# Patient Record
Sex: Male | Born: 1970 | Race: Black or African American | Hispanic: No | Marital: Married | Smoking: Never smoker
Health system: Southern US, Community
[De-identification: ages and names within clinical notes are randomized; demographics above are authoritative.]

## PROBLEM LIST (undated history)

## (undated) DIAGNOSIS — Z8719 Personal history of other diseases of the digestive system: Secondary | ICD-10-CM

## (undated) DIAGNOSIS — K4020 Bilateral inguinal hernia, without obstruction or gangrene, not specified as recurrent: Secondary | ICD-10-CM

## (undated) DIAGNOSIS — E269 Hyperaldosteronism, unspecified: Secondary | ICD-10-CM

## (undated) DIAGNOSIS — H25012 Cortical age-related cataract, left eye: Secondary | ICD-10-CM

## (undated) DIAGNOSIS — H3530 Unspecified macular degeneration: Secondary | ICD-10-CM

## (undated) DIAGNOSIS — H268 Other specified cataract: Secondary | ICD-10-CM

## (undated) DIAGNOSIS — H35359 Cystoid macular degeneration, unspecified eye: Secondary | ICD-10-CM

## (undated) DIAGNOSIS — H4440 Unspecified hypotony of eye: Secondary | ICD-10-CM

## (undated) DIAGNOSIS — N189 Chronic kidney disease, unspecified: Secondary | ICD-10-CM

## (undated) DIAGNOSIS — M1990 Unspecified osteoarthritis, unspecified site: Secondary | ICD-10-CM

## (undated) DIAGNOSIS — H2701 Aphakia, right eye: Secondary | ICD-10-CM

## (undated) DIAGNOSIS — T8859XA Other complications of anesthesia, initial encounter: Secondary | ICD-10-CM

## (undated) DIAGNOSIS — T868419 Corneal transplant failure, unspecified eye: Secondary | ICD-10-CM

## (undated) DIAGNOSIS — T86841 Corneal transplant failure: Secondary | ICD-10-CM

## (undated) DIAGNOSIS — H209 Unspecified iridocyclitis: Secondary | ICD-10-CM

## (undated) DIAGNOSIS — H401190 Primary open-angle glaucoma, unspecified eye, stage unspecified: Secondary | ICD-10-CM

## (undated) DIAGNOSIS — I10 Essential (primary) hypertension: Secondary | ICD-10-CM

## (undated) DIAGNOSIS — K219 Gastro-esophageal reflux disease without esophagitis: Secondary | ICD-10-CM

## (undated) DIAGNOSIS — H4040X0 Glaucoma secondary to eye inflammation, unspecified eye, stage unspecified: Secondary | ICD-10-CM

## (undated) DIAGNOSIS — T4145XA Adverse effect of unspecified anesthetic, initial encounter: Secondary | ICD-10-CM

## (undated) DIAGNOSIS — H44119 Panuveitis, unspecified eye: Secondary | ICD-10-CM

## (undated) HISTORY — PX: CORNEAL TRANSPLANT: SHX108

## (undated) HISTORY — PX: FRACTURE SURGERY: SHX138

## (undated) HISTORY — DX: Corneal transplant failure: T86.841

## (undated) HISTORY — DX: Unspecified iridocyclitis: H20.9

## (undated) HISTORY — DX: Corneal transplant failure, unspecified eye: T86.8419

## (undated) HISTORY — DX: Other specified cataract: H26.8

## (undated) HISTORY — DX: Glaucoma secondary to eye inflammation, unspecified eye, stage unspecified: H40.40X0

## (undated) HISTORY — DX: Essential (primary) hypertension: I10

## (undated) HISTORY — DX: Panuveitis, unspecified eye: H44.119

## (undated) HISTORY — DX: Cystoid macular degeneration, unspecified eye: H35.359

## (undated) HISTORY — PX: OTHER SURGICAL HISTORY: SHX169

## (undated) HISTORY — DX: Unspecified macular degeneration: H35.30

## (undated) HISTORY — DX: Cortical age-related cataract, left eye: H25.012

## (undated) HISTORY — DX: Primary open-angle glaucoma, unspecified eye, stage unspecified: H40.1190

## (undated) HISTORY — DX: Unspecified hypotony of eye: H44.40

## (undated) HISTORY — DX: Aphakia, right eye: H27.01

## (undated) HISTORY — DX: Hyperaldosteronism, unspecified: E26.9

---

## 2009-09-18 ENCOUNTER — Emergency Department: Payer: Self-pay | Admitting: Emergency Medicine

## 2009-12-15 ENCOUNTER — Inpatient Hospital Stay: Payer: Self-pay | Admitting: Internal Medicine

## 2011-08-30 DIAGNOSIS — H209 Unspecified iridocyclitis: Secondary | ICD-10-CM | POA: Insufficient documentation

## 2011-08-30 DIAGNOSIS — H268 Other specified cataract: Secondary | ICD-10-CM | POA: Insufficient documentation

## 2011-08-31 DIAGNOSIS — H401190 Primary open-angle glaucoma, unspecified eye, stage unspecified: Secondary | ICD-10-CM | POA: Insufficient documentation

## 2011-09-24 DIAGNOSIS — I10 Essential (primary) hypertension: Secondary | ICD-10-CM | POA: Insufficient documentation

## 2012-05-29 ENCOUNTER — Ambulatory Visit: Payer: Self-pay

## 2012-05-29 LAB — CREATININE, SERUM: EGFR (Non-African Amer.): 42 — ABNORMAL LOW

## 2013-03-30 DIAGNOSIS — H35359 Cystoid macular degeneration, unspecified eye: Secondary | ICD-10-CM | POA: Insufficient documentation

## 2014-01-11 HISTORY — PX: ADRENAL GLAND SURGERY: SHX544

## 2014-05-17 DIAGNOSIS — I10 Essential (primary) hypertension: Secondary | ICD-10-CM | POA: Insufficient documentation

## 2014-05-17 DIAGNOSIS — T868419 Corneal transplant failure, unspecified eye: Secondary | ICD-10-CM | POA: Insufficient documentation

## 2014-05-17 DIAGNOSIS — H2701 Aphakia, right eye: Secondary | ICD-10-CM | POA: Insufficient documentation

## 2014-05-17 DIAGNOSIS — H4440 Unspecified hypotony of eye: Secondary | ICD-10-CM | POA: Insufficient documentation

## 2014-05-17 DIAGNOSIS — T86841 Corneal transplant failure: Secondary | ICD-10-CM

## 2015-01-12 HISTORY — PX: EYE SURGERY: SHX253

## 2015-05-22 DIAGNOSIS — E269 Hyperaldosteronism, unspecified: Secondary | ICD-10-CM | POA: Insufficient documentation

## 2015-05-22 DIAGNOSIS — I159 Secondary hypertension, unspecified: Secondary | ICD-10-CM | POA: Insufficient documentation

## 2015-06-17 DIAGNOSIS — H25012 Cortical age-related cataract, left eye: Secondary | ICD-10-CM | POA: Insufficient documentation

## 2015-06-17 DIAGNOSIS — H2512 Age-related nuclear cataract, left eye: Secondary | ICD-10-CM | POA: Insufficient documentation

## 2015-10-10 DIAGNOSIS — Z947 Corneal transplant status: Secondary | ICD-10-CM | POA: Insufficient documentation

## 2016-03-04 DIAGNOSIS — H44119 Panuveitis, unspecified eye: Secondary | ICD-10-CM | POA: Insufficient documentation

## 2016-03-04 DIAGNOSIS — H3530 Unspecified macular degeneration: Secondary | ICD-10-CM | POA: Insufficient documentation

## 2016-03-08 ENCOUNTER — Encounter: Payer: Self-pay | Admitting: Surgery

## 2016-03-08 ENCOUNTER — Ambulatory Visit (INDEPENDENT_AMBULATORY_CARE_PROVIDER_SITE_OTHER): Payer: Medicare Other | Admitting: Surgery

## 2016-03-08 VITALS — BP 148/99 | HR 114 | Temp 98.3°F | Ht 68.0 in | Wt 174.0 lb

## 2016-03-08 DIAGNOSIS — K4020 Bilateral inguinal hernia, without obstruction or gangrene, not specified as recurrent: Secondary | ICD-10-CM | POA: Diagnosis not present

## 2016-03-08 NOTE — Patient Instructions (Signed)
Inguinal Hernia, Adult Introduction An inguinal hernia is when fat or the intestines push through the area where the leg meets the lower belly (groin) and make a rounded lump (bulge). This condition happens over time. There are three types of inguinal hernias. These types include:  Hernias that can be pushed back into the belly (are reducible).  Hernias that cannot be pushed back into the belly (are incarcerated).  Hernias that cannot be pushed back into the belly and lose their blood supply (get strangulated). This type needs emergency surgery. Follow these instructions at home: Lifestyle  Drink enough fluid to keep your urine (pee) clear or pale yellow.  Eat plenty of fruits, vegetables, and whole grains. These have a lot of fiber. Talk with your doctor if you have questions.  Avoid lifting heavy objects.  Avoid standing for long periods of time.  Do not use tobacco products. These include cigarettes, chewing tobacco, or e-cigarettes. If you need help quitting, ask your doctor.  Try to stay at a healthy weight. General instructions  Do not try to force the hernia back in.  Watch your hernia for any changes in color or size. Let your doctor know if there are any changes.  Take over-the-counter and prescription medicines only as told by your doctor.  Keep all follow-up visits as told by your doctor. This is important. Contact a doctor if:  You have a fever.  You have new symptoms.  Your symptoms get worse. Get help right away if:  The area where the legs meets the lower belly has:  Pain that gets worse suddenly.  A bulge that gets bigger suddenly and does not go down.  A bulge that turns red or purple.  A bulge that is painful to the touch.  You are a man and your scrotum:  Suddenly feels painful.  Suddenly changes in size.  You feel sick to your stomach (nauseous) and this feeling does not go away.  You throw up (vomit) and this keeps happening.  You feel  your heart beating a lot more quickly than normal.  You cannot poop (have a bowel movement) or pass gas. This information is not intended to replace advice given to you by your health care provider. Make sure you discuss any questions you have with your health care provider. Document Released: 01/28/2006 Document Revised: 06/05/2015 Document Reviewed: 11/07/2013  2017 Elsevier  

## 2016-03-08 NOTE — Progress Notes (Signed)
03/08/2016  Reason for Visit:  Right inguinal hernia  History of Present Illness: Christopher Mcdaniel is a 46 y.o. male referred by his PCP for evaluation of a right inguinal hernia. The patient reports that he noted this about 6-8 months ago on the right groin. He feels that this may have pain increasing in size over these months. However he has very minimal discomfort if any at all. He reports that its flatter during the morning time when he wakes up and then bulges out more as the day goes by. However he is able to reduce it without any discomfort.  Reports regular bowel movements and flatus. Denies any fevers or chills, abdominal pain, chest pain or shortness of breath. Denies any bulging on the left side.  The patient does report that he has episodes of nausea and vomiting in the past, which he attributes to acid reflux. He started taking Nexium on a daily basis and this has improved his symptoms.   Past Medical History: Past Medical History:  Diagnosis Date  . Aphakia, right   . Cataract cortical, senile, left   . CME (cystoid macular edema)   . Failed corneal transplant   . Hyperaldosteronism ([place])   . Hypertension   . Hypotony of eye   . Macular degeneration   . Mature cataract   . Panuveitis   . POAG (primary open-angle glaucoma)   . Uveitic glaucoma      Past Surgical History: --Transplanted cornea --Left knee surgery  Home Medications: Prior to Admission medications   Medication Sig Start Date End Date Taking? Authorizing Provider  Aspirin-Acetaminophen-Caffeine (EXCEDRIN PO) Take 1 tablet by mouth 1 day or 1 dose.    Historical Provider, MD  brimonidine-timolol (COMBIGAN) 0.2-0.5 % ophthalmic solution Apply 1 drop to eye 1 day or 1 dose. 06/27/15 06/26/16  Historical Provider, MD  carvedilol (COREG) 25 MG tablet Take 50 mg by mouth 1 day or 1 dose.    Historical Provider, MD  chlorthalidone (HYGROTON) 50 MG tablet Take 1 tablet by mouth 1 day or 1 dose.    Historical  Provider, MD  cloNIDine (CATAPRES - DOSED IN MG/24 HR) 0.2 mg/24hr patch Place 1 mg onto the skin 1 day or 1 dose.    Historical Provider, MD  cyclopentolate (CYCLODRYL,CYCLOGYL) 1 % ophthalmic solution Apply 1 drop to eye 1 day or 1 dose. 06/30/15   Historical Provider, MD  diphenhydrAMINE (BENADRYL) 25 MG tablet Take 1 tablet by mouth 1 day or 1 dose.    Historical Provider, MD  dorzolamide (TRUSOPT) 2 % ophthalmic solution Apply 1 drop to eye 1 day or 1 dose. 11/19/14   Historical Provider, MD  hydrALAZINE (APRESOLINE) 10 MG tablet Take 10 mg by mouth 1 day or 1 dose.    Historical Provider, MD  NIFEdipine (ADALAT CC) 90 MG 24 hr tablet Take 90 mg by mouth 1 day or 1 dose.    Historical Provider, MD  prednisoLONE acetate (PRED FORTE) 1 % ophthalmic suspension Place 1 drop into both eyes 1 day or 1 dose.    Historical Provider, MD  quinapril (ACCUPRIL) 40 MG tablet Take 40 mg by mouth 1 day or 1 dose.    Historical Provider, MD  spironolactone (ALDACTONE) 100 MG tablet Take 1 tablet by mouth 1 day or 1 dose. 02/18/16 02/17/17  Historical Provider, MD  timolol (TIMOPTIC) 0.25 % ophthalmic solution Place 1 drop into both eyes 1 day or 1 dose.    Historical Provider, MD  triamterene-hydrochlorothiazide (MAXZIDE) 75-50 MG tablet Take 1 tablet by mouth 1 day or 1 dose.    Historical Provider, MD    Allergies: Allergies  Allergen Reactions  . Quinapril Swelling    angioedema    Social History:  reports that he has never smoked. He has never used smokeless tobacco. He reports that he does not drink alcohol or use drugs.   Family History: No family history on file.  Review of Systems: Review of Systems  Constitutional: Negative for chills and fever.  HENT: Negative for hearing loss.   Respiratory: Negative for cough and shortness of breath.   Cardiovascular: Negative for chest pain and leg swelling.  Gastrointestinal: Positive for nausea and vomiting. Negative for abdominal pain, constipation  and diarrhea.  Genitourinary: Negative for dysuria and hematuria.  Musculoskeletal: Negative for myalgias.  Skin: Negative for rash.  Neurological: Negative for dizziness.  Psychiatric/Behavioral: Negative for depression.  All other systems reviewed and are negative.   Physical Exam BP (!) 148/99   Pulse (!) 114   Temp 98.3 F (36.8 C) (Oral)   Ht 5\' 8"  (1.727 m)   Wt 78.9 kg (174 lb)   BMI 26.46 kg/m  CONSTITUTIONAL: No acute distress HEENT:  Normocephalic, atraumatic, extraocular motion intact. NECK: Trachea is midline, and there is no jugular venous distension.  RESPIRATORY:  Lungs are clear, and breath sounds are equal bilaterally. Normal respiratory effort without pathologic use of accessory muscles. CARDIOVASCULAR: Heart is regular without murmurs, gallops, or rubs. GI: The abdomen is soft, nondistended, nontender to palpation. The patient has medium size right inguinal hernia which is reducible as well as a small sized left inguinal hernia which is also reducible. Neither one causes any discomfort. There is no umbilical or ventral hernias. MUSCULOSKELETAL:  Normal muscle strength and tone in all four extremities.  No peripheral edema or cyanosis. SKIN: Skin turgor is normal. There are no pathologic skin lesions.  NEUROLOGIC:  Motor and sensation is grossly normal.  Cranial nerves are grossly intact. PSYCH:  Alert and oriented to person, place and time. Affect is normal.  Laboratory Analysis: No results found for this or any previous visit (from the past 24 hour(s)).  Imaging: No results found.  Assessment and Plan: This is a 46 y.o. male with bilateral inguinal hernias, currently asymptomatic.  Discussed with the patient that currently given the minimal discomfort from the inguinal hernias, there is no specific urgency to repair them. Discussed with the patient the different types of inguinal hernias including a reducible, incarcerated, and strangulated. Patient is aware  that currently his hernias are reducible, but the patient does understand the possible complications with having an inguinal hernia that could require emergency surgery. However given that he is currently not having any symptoms, it is reasonable to do watchful waiting. The patient does understand that these hernias left untreated can continue growing in size.  The patient did ask about any lifting or restrictions that he has. I informed the patient to try to avoid any significant heavy lifting as that would cause the hernias to bulge out more and potentially be more comfortable. Also recommended that he could wear a hernia belt or truss that he could buy over-the-counter for comfort.   The patient understands that he can always call the office back and set up a new appointment if he changes his mind and wishes to have surgical repair.  Face-to-face time spent with the patient and care providers was 45 minutes, with more than 50% of  the time spent counseling, educating, and coordinating care of the patient.     Melvyn Neth, [place]

## 2016-03-30 ENCOUNTER — Ambulatory Visit: Payer: Medicare Other | Admitting: Podiatry

## 2016-04-16 ENCOUNTER — Encounter: Payer: Self-pay | Admitting: Podiatry

## 2016-04-16 ENCOUNTER — Ambulatory Visit (INDEPENDENT_AMBULATORY_CARE_PROVIDER_SITE_OTHER): Payer: Medicare Other | Admitting: Podiatry

## 2016-04-16 ENCOUNTER — Ambulatory Visit (INDEPENDENT_AMBULATORY_CARE_PROVIDER_SITE_OTHER): Payer: Medicare Other

## 2016-04-16 VITALS — BP 139/101 | HR 86 | Resp 16

## 2016-04-16 DIAGNOSIS — D492 Neoplasm of unspecified behavior of bone, soft tissue, and skin: Secondary | ICD-10-CM

## 2016-04-16 NOTE — Progress Notes (Signed)
   Subjective:  Patient presents today for evaluation of right great toe swelling has been going on for proximal he 6-7 months now. Patient has tried ice, heat with no relief. Patient is not diabetic. Patient states that when he wears his tennis shoes with significant pressure to his right great toe. Patient denies trauma.    Objective/Physical Exam General: The patient is alert and oriented x3 in no acute distress.  Dermatology: Skin is warm, dry and supple bilateral lower extremities. Negative for open lesions or macerations.  Vascular: Palpable pedal pulses bilaterally. No edema or erythema noted. Capillary refill within normal limits.  Neurological: Epicritic and protective threshold grossly intact bilaterally.   Musculoskeletal Exam: Large palpable mass noted to the plantar lateral aspect of the right great toe.  Radiographic Exam:  Normal osseous mineralization. Joint spaces preserved. No fracture/dislocation/boney destruction.  No evidence of fluid-filled abscess or cyst or osseous tumor.  Assessment: #1 soft tissue tumor right great toe 6-7 months   Plan of Care:  #1 Patient was evaluated. X-rays were reviewed #2 today were going to order an MRI of the right great toe to visualize a soft tissue tumor. #3 patient may require surgical intervention pending MRI results. #4 return to clinic in 3 weeks   Edrick Kins, DPM [facility]  Dr. Edrick Kins, [address]                                        [place], [place] [address]                Office [phone]  Fax [phone]

## 2016-04-19 ENCOUNTER — Telehealth: Payer: Self-pay | Admitting: Podiatry

## 2016-04-19 DIAGNOSIS — D492 Neoplasm of unspecified behavior of bone, soft tissue, and skin: Secondary | ICD-10-CM

## 2016-04-19 DIAGNOSIS — Z01818 Encounter for other preprocedural examination: Secondary | ICD-10-CM

## 2016-04-19 NOTE — Telephone Encounter (Signed)
Faxed MRI with and without Contrast to [facility] and to [facility] for pre-cert.

## 2016-04-19 NOTE — Addendum Note (Signed)
Addended by: Harriett Sine D on: 04/19/2016 09:54 AM   Modules accepted: Orders

## 2016-04-19 NOTE — Telephone Encounter (Signed)
Dr. Amalia Hailey wanted this pt to have a MRI

## 2016-04-29 ENCOUNTER — Ambulatory Visit
Admission: RE | Admit: 2016-04-29 | Discharge: 2016-04-29 | Disposition: A | Discharge status: Home / Self Care | Payer: Medicare Other | Source: Ambulatory Visit | Attending: Podiatry | Admitting: Podiatry

## 2016-04-29 DIAGNOSIS — D492 Neoplasm of unspecified behavior of bone, soft tissue, and skin: Secondary | ICD-10-CM | POA: Diagnosis present

## 2016-04-29 LAB — POCT I-STAT CREATININE: CREATININE: 2 mg/dL — AB (ref 0.61–1.24)

## 2016-04-29 MED ORDER — GADOBENATE DIMEGLUMINE 529 MG/ML IV SOLN
8.0000 mL | Freq: Once | INTRAVENOUS | Status: AC | PRN
  Administered 2016-04-29: 8 mL via INTRAVENOUS

## 2016-10-18 ENCOUNTER — Encounter: Payer: Self-pay | Admitting: *Deleted

## 2016-10-19 ENCOUNTER — Ambulatory Visit: Admission: RE | Admit: 2016-10-19 | Payer: Medicare Other | Source: Ambulatory Visit | Admitting: Internal Medicine

## 2016-10-19 ENCOUNTER — Encounter: Admission: RE | Payer: Self-pay | Source: Ambulatory Visit

## 2016-10-19 HISTORY — DX: Chronic kidney disease, unspecified: N18.9

## 2016-10-19 SURGERY — ESOPHAGOGASTRODUODENOSCOPY (EGD) WITH PROPOFOL
Anesthesia: General

## 2016-11-25 ENCOUNTER — Ambulatory Visit: Payer: Self-pay | Admitting: General Surgery

## 2016-12-14 ENCOUNTER — Other Ambulatory Visit: Payer: Self-pay

## 2016-12-14 ENCOUNTER — Encounter
Admission: RE | Admit: 2016-12-14 | Discharge: 2016-12-14 | Disposition: A | Discharge status: Home / Self Care | Payer: Medicare Other | Source: Ambulatory Visit | Attending: General Surgery | Admitting: General Surgery

## 2016-12-14 DIAGNOSIS — Z01812 Encounter for preprocedural laboratory examination: Secondary | ICD-10-CM | POA: Insufficient documentation

## 2016-12-14 HISTORY — DX: Personal history of other diseases of the digestive system: Z87.19

## 2016-12-14 HISTORY — DX: Gastro-esophageal reflux disease without esophagitis: K21.9

## 2016-12-14 HISTORY — DX: Unspecified osteoarthritis, unspecified site: M19.90

## 2016-12-14 LAB — CBC WITH DIFFERENTIAL/PLATELET
BASOS PCT: 1 %
Basophils Absolute: 0.1 10*3/uL (ref 0–0.1)
Eosinophils Absolute: 0.2 10*3/uL (ref 0–0.7)
Eosinophils Relative: 2 %
HEMATOCRIT: 40.6 % (ref 40.0–52.0)
HEMOGLOBIN: 13.6 g/dL (ref 13.0–18.0)
Lymphocytes Relative: 18 %
Lymphs Abs: 2 10*3/uL (ref 1.0–3.6)
MCH: 30.1 pg (ref 26.0–34.0)
MCHC: 33.4 g/dL (ref 32.0–36.0)
MCV: 89.9 fL (ref 80.0–100.0)
MONOS PCT: 5 %
Monocytes Absolute: 0.6 10*3/uL (ref 0.2–1.0)
NEUTROS ABS: 8 10*3/uL — AB (ref 1.4–6.5)
NEUTROS PCT: 74 %
Platelets: 187 10*3/uL (ref 150–440)
RBC: 4.52 MIL/uL (ref 4.40–5.90)
RDW: 16 % — ABNORMAL HIGH (ref 11.5–14.5)
WBC: 10.8 10*3/uL — ABNORMAL HIGH (ref 3.8–10.6)

## 2016-12-14 LAB — COMPREHENSIVE METABOLIC PANEL
ALK PHOS: 53 U/L (ref 38–126)
ALT: 13 U/L — AB (ref 17–63)
AST: 19 U/L (ref 15–41)
Albumin: 3.7 g/dL (ref 3.5–5.0)
Anion gap: 9 (ref 5–15)
BUN: 19 mg/dL (ref 6–20)
CO2: 29 mmol/L (ref 22–32)
Calcium: 9.3 mg/dL (ref 8.9–10.3)
Chloride: 102 mmol/L (ref 101–111)
Creatinine, Ser: 1.55 mg/dL — ABNORMAL HIGH (ref 0.61–1.24)
GFR, EST NON AFRICAN AMERICAN: 52 mL/min — AB (ref >60)
Glucose, Bld: 109 mg/dL — ABNORMAL HIGH (ref 65–99)
Potassium: 3.6 mmol/L (ref 3.5–5.1)
Sodium: 140 mmol/L (ref 135–145)
Total Bilirubin: 0.4 mg/dL (ref 0.3–1.2)
Total Protein: 7.6 g/dL (ref 6.5–8.1)

## 2016-12-14 LAB — PROTIME-INR
INR: 0.98
PROTHROMBIN TIME: 12.9 s (ref 11.4–15.2)

## 2016-12-14 LAB — APTT: aPTT: 34 seconds (ref 24–36)

## 2016-12-14 NOTE — Pre-Procedure Instructions (Signed)
  2018 November ECG 12 lead (Adult)11/25/2016 [place] Component Name Value Ref Range  EKG Systolic BP  mmHg  EKG Diastolic BP  mmHg  EKG Ventricular Rate 87 BPM  EKG Atrial Rate 87 BPM  EKG P-R Interval 144 ms  EKG QRS Duration 82 ms  EKG Q-T Interval 374 ms  EKG QTC Calculation 450 ms  EKG Calculated P Axis 54 degrees  EKG Calculated R Axis 12 degrees  EKG Calculated T Axis 8 degrees  Result Narrative  NORMAL SINUS RHYTHM VOLTAGE CRITERIA FOR LEFT VENTRICULAR HYPERTROPHY NONSPECIFIC T WAVE ABNORMALITY ABNORMAL ECG WHEN COMPARED WITH ECG OF 05-Aug-2016 23:33, T WAVE INVERSION LESS EVIDENT IN INFERIOR LEADS Confirmed by Hunt Oris ([id number]) on 11/25/2016 8:19:48 AM        Talitha Givens, MD Resident 11/23/16 2225

## 2016-12-14 NOTE — Patient Instructions (Signed)
  Your procedure is scheduled on:Wednesday Dec. 12th , 2018. Report to Same Day Surgery. To find out your arrival time please call [phone] between 1PM - 3PM on Tuesday Dec. 11, 2018 .  Remember: Instructions that are not followed completely may result in serious medical risk, up to and including death, or upon the discretion of your surgeon and anesthesiologist your surgery may need to be rescheduled.    _x___ 1. Do not eat food after midnight night prior to surgery. No gum   chewing or hard candies, snacks or breakfast.    2022/06/30 drink the following: water, Gatorade, clear apple juice, black coffee     or black tea up until 2 hours prior to ARRIVAL time.     ____ 2. No Alcohol for 24 hours before or after surgery.   ____ 3. Bring all medications with you on the day of surgery if instructed.    __x__ 4. Notify your doctor if there is any change in your medical condition     (cold, fever, infections).    _____ 5.   Do Not Smoke or use e-cigarettes For 24 Hours Prior to Your   Surgery.  Do not use any chewable tobacco products for at least 6   hours prior to  surgery.                      Do not wear jewelry, make-up, hairpins, clips or nail polish.  Do not wear lotions, powders, or perfumes.   Do not shave 48 hours prior to surgery. Men may shave face and neck.  Do not bring valuables to the hospital.    [facility] is not responsible for any belongings or valuables.               Contacts, dentures or bridgework may not be worn into surgery.  Leave your suitcase in the car. After surgery it may be brought to your room.  For patients admitted to the hospital, discharge time is determined by your  treatment team.   Patients discharged the day of surgery will not be allowed to drive home.    Please read over the following fact sheets that you were given:   [facility] Preparing for Surgery  __x__ Take these medicines the morning of surgery with A SIP OF WATER:    1. carvedilol  (COREG)  2. chlorthalidone (HYGROTON)  3. pantoprazole (PROTONIX)      ____ Fleet Enema (as directed)   _x_ Use CHG Soap as directed on instruction sheet  ____ Use inhalers on the day of surgery and bring to hospital day of surgery  ____ Stop metformin 2 days prior to surgery    ____ Take 1/2 of usual insulin dose the night before surgery and none on the morning of          surgery.   ____ Stop Eliquis/Coumadin/Plavix/aspirin on does not apply.  _x___ Stop Anti-inflammatories such as aspirin-acetaminophen-caffeine (EXCEDRIN MIGRAINE)  Advil, Aleve, Ibuprofen, Motrin, Naproxen,  Naprosyn, Goodies powders or aspirin products. OK to take Tylenol.   ____ Stop supplements until after surgery.    ____ Bring C-Pap to the hospital.

## 2017-01-06 ENCOUNTER — Ambulatory Visit: Admission: RE | Admit: 2017-01-06 | Payer: Medicare Other | Source: Ambulatory Visit | Admitting: General Surgery

## 2017-01-06 ENCOUNTER — Encounter: Admission: RE | Payer: Self-pay | Source: Ambulatory Visit

## 2017-01-06 SURGERY — LAPAROSCOPIC CHOLECYSTECTOMY
Anesthesia: Choice

## 2017-01-18 MED ORDER — CEFAZOLIN SODIUM-DEXTROSE 2-4 GM/100ML-% IV SOLN: 2.0000 g | INTRAVENOUS | Status: DC

## 2017-01-19 ENCOUNTER — Encounter: Admission: RE | Payer: Self-pay | Source: Ambulatory Visit

## 2017-01-19 ENCOUNTER — Ambulatory Visit: Admission: RE | Admit: 2017-01-19 | Payer: Medicare Other | Source: Ambulatory Visit | Admitting: Internal Medicine

## 2017-01-19 SURGERY — ESOPHAGOGASTRODUODENOSCOPY (EGD) WITH PROPOFOL
Anesthesia: General

## 2017-01-25 ENCOUNTER — Other Ambulatory Visit: Payer: Self-pay | Admitting: General Surgery

## 2017-01-25 ENCOUNTER — Ambulatory Visit
Admission: RE | Admit: 2017-01-25 | Discharge: 2017-01-25 | Disposition: A | Discharge status: Home / Self Care | Payer: Medicare Other | Source: Ambulatory Visit | Attending: General Surgery | Admitting: General Surgery

## 2017-01-25 DIAGNOSIS — K8010 Calculus of gallbladder with chronic cholecystitis without obstruction: Secondary | ICD-10-CM | POA: Diagnosis present

## 2017-01-25 DIAGNOSIS — N281 Cyst of kidney, acquired: Secondary | ICD-10-CM | POA: Insufficient documentation

## 2017-01-26 ENCOUNTER — Other Ambulatory Visit: Payer: Self-pay | Admitting: General Surgery

## 2017-01-26 DIAGNOSIS — K8018 Calculus of gallbladder with other cholecystitis without obstruction: Secondary | ICD-10-CM

## 2017-02-02 ENCOUNTER — Other Ambulatory Visit: Payer: Self-pay

## 2017-02-02 ENCOUNTER — Encounter
Admission: RE | Admit: 2017-02-02 | Discharge: 2017-02-02 | Disposition: A | Discharge status: Home / Self Care | Payer: Medicare Other | Source: Ambulatory Visit | Attending: General Surgery | Admitting: General Surgery

## 2017-02-02 DIAGNOSIS — Z01818 Encounter for other preprocedural examination: Secondary | ICD-10-CM | POA: Insufficient documentation

## 2017-02-02 HISTORY — DX: Other complications of anesthesia, initial encounter: T88.59XA

## 2017-02-02 HISTORY — DX: Adverse effect of unspecified anesthetic, initial encounter: T41.45XA

## 2017-02-02 NOTE — Patient Instructions (Signed)
Your procedure is scheduled on: 02/09/17 Wed Report to Same Day Surgery 2nd floor medical mall [facility] Entrance-take elevator on left to 2nd floor.  Check in with surgery information desk.) To find out your arrival time please call [phone] between 1PM - 3PM on 02/08/17 Tues  Remember: Instructions that are not followed completely may result in serious medical risk, up to and including death, or upon the discretion of your surgeon and anesthesiologist your surgery may need to be rescheduled.    _x___ 1. Do not eat food after midnight the night before your procedure. You may drink clear liquids up to 2 hours before you are scheduled to arrive at the hospital for your procedure.  Do not drink clear liquids within 2 hours of your scheduled arrival to the hospital.  Clear liquids include  --Water or Apple juice without pulp  --Clear carbohydrate beverage such as ClearFast or Gatorade  --Black Coffee or Clear Tea (No milk, no creamers, do not add anything to                  the coffee or Tea Type 1 and type 2 diabetics should only drink water.  No gum chewing or hard candies.     __x__ 2. No Alcohol for 24 hours before or after surgery.   __x__3. No Smoking for 24 prior to surgery.   ____  4. Bring all medications with you on the day of surgery if instructed.    __x__ 5. Notify your doctor if there is any change in your medical condition     (cold, fever, infections).     Do not wear jewelry, make-up, hairpins, clips or nail polish.  Do not wear lotions, powders, or perfumes. You may wear deodorant.  Do not shave 48 hours prior to surgery. Men may shave face and neck.  Do not bring valuables to the hospital.    [facility] is not responsible for any belongings or valuables.               Contacts, dentures or bridgework may not be worn into surgery.  Leave your suitcase in the car. After surgery it may be brought to your room.  For patients admitted to the hospital, discharge  time is determined by your                       treatment team.   Patients discharged the day of surgery will not be allowed to drive home.  You will need someone to drive you home and stay with you the night of your procedure.    Please read over the following fact sheets that you were given:   [facility] Preparing for Surgery and or MRSA Information   _x___ Take anti-hypertensive listed below, cardiac, seizure, asthma,     anti-reflux and psychiatric medicines. These include:  1. carvedilol (COREG) 25 MG tablet  2.chlorthalidone (HYGROTON) 50 MG tablet  3.cloNIDine (CATAPRES - DOSED IN MG/24 HR) 0.2 mg/24hr patch  4.NIFEdipine (ADALAT CC) 90 MG 24 hr tablet  5.pantoprazole (PROTONIX) 40 MG tablet  6.traMADol (ULTRAM) 50 MG tablet  7. Use eye drops as usual  ____Fleets enema or Magnesium Citrate as directed.   _x___ Use CHG Soap or sage wipes as directed on instruction sheet   ____ Use inhalers on the day of surgery and bring to hospital day of surgery  ____ Stop Metformin and Janumet 2 days prior to surgery.    ____  Take 1/2 of usual insulin dose the night before surgery and none on the morning     surgery.   _x___ Follow recommendations from Cardiologist, Pulmonologist or PCP regarding          stopping Aspirin, Coumadin, Plavix ,Eliquis, Effient, or Pradaxa, and Pletal.  X____Stop Anti-inflammatories such as Advil, Aleve, Ibuprofen, Motrin, Naproxen, Naprosyn, Goodies powders or aspirin products. OK to take Tylenol and                          Celebrex.   _x___ Stop supplements until after surgery.  But may continue Vitamin D, Vitamin B,       and multivitamin.   ____ Bring C-Pap to the hospital.

## 2017-02-02 NOTE — Pre-Procedure Instructions (Addendum)
Called Dr Marcheta Grammes regarding adrenal cortex, hyperaldosteronism, and elevated BP. "Ensure physician managing the adrenal issue is aware of upcoming surgery."  "Endocrinologist aware and gave clearance." per Dr Darrol Poke office.  Awaiting clearance note from office.

## 2017-02-03 ENCOUNTER — Encounter (HOSPITAL_COMMUNITY)
Admission: RE | Admit: 2017-02-03 | Discharge: 2017-02-03 | Disposition: A | Discharge status: Home / Self Care | Payer: Medicare Other | Source: Ambulatory Visit | Attending: General Surgery | Admitting: General Surgery

## 2017-02-03 DIAGNOSIS — K8018 Calculus of gallbladder with other cholecystitis without obstruction: Secondary | ICD-10-CM | POA: Diagnosis present

## 2017-02-03 MED ORDER — TECHNETIUM TC 99M MEBROFENIN IV KIT
5.3500 | PACK | Freq: Once | INTRAVENOUS | Status: AC | PRN
  Administered 2017-02-03: 5.35 via INTRAVENOUS

## 2017-02-09 ENCOUNTER — Encounter: Admission: RE | Payer: Self-pay | Source: Ambulatory Visit

## 2017-02-09 ENCOUNTER — Ambulatory Visit: Admission: RE | Admit: 2017-02-09 | Payer: Medicare Other | Source: Ambulatory Visit | Admitting: General Surgery

## 2017-02-09 SURGERY — LAPAROSCOPIC CHOLECYSTECTOMY
Anesthesia: Choice

## 2018-01-30 ENCOUNTER — Other Ambulatory Visit: Payer: Self-pay

## 2018-01-30 DIAGNOSIS — N189 Chronic kidney disease, unspecified: Secondary | ICD-10-CM | POA: Diagnosis not present

## 2018-01-30 DIAGNOSIS — E876 Hypokalemia: Secondary | ICD-10-CM | POA: Diagnosis not present

## 2018-01-30 DIAGNOSIS — Z79899 Other long term (current) drug therapy: Secondary | ICD-10-CM | POA: Diagnosis not present

## 2018-01-30 DIAGNOSIS — I129 Hypertensive chronic kidney disease with stage 1 through stage 4 chronic kidney disease, or unspecified chronic kidney disease: Secondary | ICD-10-CM | POA: Insufficient documentation

## 2018-01-30 NOTE — ED Triage Notes (Addendum)
Pt arrives to ED via POV from home with c/o headache and hypertension x1 day. Pt denies h/x of migraines, but does report h/x of HTN. Pt reports non-compliance with r/x'd antihypertensive medications for several days. Pt reports frontal headache, denies N/V/D. No pian medications taken for headache PTA.

## 2018-01-31 ENCOUNTER — Emergency Department
Admission: EM | Admit: 2018-01-31 | Discharge: 2018-01-31 | Disposition: A | Discharge status: Home / Self Care | Payer: Medicare Other | Attending: Emergency Medicine | Admitting: Emergency Medicine

## 2018-01-31 DIAGNOSIS — I10 Essential (primary) hypertension: Secondary | ICD-10-CM

## 2018-01-31 DIAGNOSIS — E876 Hypokalemia: Secondary | ICD-10-CM

## 2018-01-31 DIAGNOSIS — I129 Hypertensive chronic kidney disease with stage 1 through stage 4 chronic kidney disease, or unspecified chronic kidney disease: Secondary | ICD-10-CM | POA: Diagnosis not present

## 2018-01-31 LAB — BASIC METABOLIC PANEL
ANION GAP: 11 (ref 5–15)
BUN: 25 mg/dL — ABNORMAL HIGH (ref 6–20)
CALCIUM: 9.5 mg/dL (ref 8.9–10.3)
CO2: 26 mmol/L (ref 22–32)
Chloride: 95 mmol/L — ABNORMAL LOW (ref 98–111)
Creatinine, Ser: 1.77 mg/dL — ABNORMAL HIGH (ref 0.61–1.24)
GFR calc Af Amer: 52 mL/min — ABNORMAL LOW (ref >60)
GFR, EST NON AFRICAN AMERICAN: 45 mL/min — AB (ref >60)
GLUCOSE: 165 mg/dL — AB (ref 70–99)
POTASSIUM: 3 mmol/L — AB (ref 3.5–5.1)
SODIUM: 132 mmol/L — AB (ref 135–145)

## 2018-01-31 LAB — CBC WITH DIFFERENTIAL/PLATELET
ABS IMMATURE GRANULOCYTES: 0.07 10*3/uL (ref 0.00–0.07)
BASOS ABS: 0 10*3/uL (ref 0.0–0.1)
BASOS PCT: 0 %
Eosinophils Absolute: 0.2 10*3/uL (ref 0.0–0.5)
Eosinophils Relative: 1 %
HCT: 41.5 % (ref 39.0–52.0)
Hemoglobin: 14.2 g/dL (ref 13.0–17.0)
IMMATURE GRANULOCYTES: 1 %
Lymphocytes Relative: 17 %
Lymphs Abs: 2.6 10*3/uL (ref 0.7–4.0)
MCH: 29.5 pg (ref 26.0–34.0)
MCHC: 34.2 g/dL (ref 30.0–36.0)
MCV: 86.1 fL (ref 80.0–100.0)
MONOS PCT: 5 %
Monocytes Absolute: 0.8 10*3/uL (ref 0.1–1.0)
NEUTROS ABS: 11.6 10*3/uL — AB (ref 1.7–7.7)
NEUTROS PCT: 76 %
NRBC: 0 % (ref 0.0–0.2)
PLATELETS: 278 10*3/uL (ref 150–400)
RBC: 4.82 MIL/uL (ref 4.22–5.81)
RDW: 14.6 % (ref 11.5–15.5)
WBC: 15.2 10*3/uL — AB (ref 4.0–10.5)

## 2018-01-31 MED ORDER — POTASSIUM CHLORIDE CRYS ER 20 MEQ PO TBCR
40.0000 meq | EXTENDED_RELEASE_TABLET | Freq: Once | ORAL | Status: AC
  Administered 2018-01-31: 40 meq via ORAL
  Filled 2018-01-31: qty 2

## 2018-01-31 MED ORDER — POTASSIUM CHLORIDE CRYS ER 20 MEQ PO TBCR: 20.0000 meq | EXTENDED_RELEASE_TABLET | Freq: Every day | ORAL | 0 refills | Status: DC

## 2018-01-31 NOTE — ED Provider Notes (Signed)
[facility] Emergency Department Provider Note  ____________________________________________   First MD Initiated Contact with Patient 01/31/18 0130     (approximate)  I have reviewed the triage vital signs and the nursing notes.   HISTORY  Chief Complaint Headache and Hypertension    HPI Christopher Mcdaniel. is a 48 y.o. male with medical history as listed below who presents for evaluation of hypertension.  He reports that he has been taking his medications except for the spironolactone which he misplaced.  He saw his regular doctor this morning who is very concerned about the blood pressure but who reportedly did not make any changes except to provide a new prescription for spironolactone.  His blood pressure at the doctor's office was about 160 over "100-something".  The primary care doctor told he and his wife to keep an eye on the blood pressure and so they checked it several times tonight and it seemed to have gone up each time.  He had a mild global headache with no focal numbness nor tingling.  When the blood pressure reached 003 systolic his wife said it was time to come to the emergency department.  He has been sleeping in the exam room and when I woke him up he said that his headache is completely gone.  He said that his clonidine patch is on and he has been taking all of his other medications.  He denies chest pain, shortness of breath, nausea, vomiting, abdominal pain, and dysuria.  He has had no swelling in his legs.  Nothing in particular makes his symptoms better or worse and his hypertension is severe but it has been apparently gradually worsening over an extended period of time.  He has no visual changes because he has very poor vision and "limited eyesight" at baseline secondary to cataracts and glaucoma.  Past Medical History:  Diagnosis Date  . Aphakia, right   . Arthritis    knees  . Cataract cortical, senile, left   . Chronic kidney disease    . CME (cystoid macular edema)   . Complication of anesthesia    wakes up during surgery  . Failed corneal transplant   . GERD (gastroesophageal reflux disease)   . History of hiatal hernia   . Hyperaldosteronism ([place])   . Hypertension   . Hypotony of eye   . Macular degeneration   . Mature cataract   . Panuveitis   . Panuveitis   . POAG (primary open-angle glaucoma)   . Uveitic glaucoma     Patient Active Problem List   Diagnosis Date Noted  . Non-recurrent bilateral inguinal hernia without obstruction or gangrene 03/08/2016  . Macular degeneration 03/04/2016  . Panuveitis 03/04/2016  . Transplanted cornea 10/10/2015  . Age-related nuclear cataract of left eye 06/17/2015  . Cataract cortical, senile, left 06/17/2015  . Hyperaldosteronism ([place]) 05/22/2015  . Hypertension, secondary 05/22/2015  . Aphakia, right 05/17/2014  . Essential hypertension 05/17/2014  . 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 Hypotony of eye 05/17/2014  . CME (cystoid macular edema) 03/30/2013  . HTN (hypertension) 09/24/2011  . POAG (primary open-angle glaucoma) 08/31/2011  . Mature cataract 08/30/2011  . 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    Past Surgical History:  Procedure Laterality Date  . ADRENAL GLAND SURGERY Bilateral 2016   biopsy  . CORNEAL TRANSPLANT    . EYE SURGERY  2017  . fracture repair of left knee    . FRACTURE SURGERY     FRACTURED LEFT KNEE  .  glaucoma eye surgery    . lens eye surgery      Prior to Admission medications   Medication Sig Start Date End Date Taking? Authorizing Provider  carvedilol (COREG) 25 MG tablet Take 50 mg by mouth 2 (two) times daily with a meal.    Yes [provider]  chlorthalidone (HYGROTON) 50 MG tablet Take 50 mg by mouth daily.    Yes [provider]  cloNIDine (CATAPRES - DOSED IN MG/24 HR) 0.2 mg/24hr patch Place 1 patch onto the skin once a week. 01/30/18  Yes [provider]  NIFEdipine (ADALAT CC) 90 MG 24 hr  tablet Take 90 mg by mouth daily.    Yes [provider]  pantoprazole (PROTONIX) 40 MG tablet Take 40 mg by mouth 2 (two) times daily.   Yes [provider]  spironolactone (ALDACTONE) 100 MG tablet Take 100 mg by mouth 2 (two) times daily.  02/18/16 01/31/18 Yes [provider]  potassium chloride SA (KLOR-CON M20) 20 MEQ tablet Take 1 tablet (20 mEq total) by mouth daily. 01/31/18   Hinda Kehr, MD    Allergies Ace inhibitors and Quinapril  Family History  Problem Relation Age of Onset  . Allergies Mother   . Hypertension Mother   . Healthy Father   . Cancer Neg Hx     Social History Social History   Tobacco Use  . Smoking status: Never Smoker  . Smokeless tobacco: Never Used  Substance Use Topics  . Alcohol use: No  . Drug use: No    Review of Systems Constitutional: No fever/chills Eyes: Limited vision at baseline. ENT: No sore throat. Cardiovascular: Denies chest pain. Respiratory: Denies shortness of breath. Gastrointestinal: No abdominal pain.  No nausea, no vomiting.  No diarrhea.  No constipation. Genitourinary: Negative for dysuria. Musculoskeletal: Negative for neck pain.  Negative for back pain. Integumentary: Negative for rash. Neurological: Gradual onset generalized headache, now resolved.  No focal numbness nor weakness.   ____________________________________________   PHYSICAL EXAM:  VITAL SIGNS: ED Triage Vitals [01/30/18 2220]  Enc Vitals Group     BP (!) 162/110     Pulse Rate 91     Resp 18     Temp 98.4 F (36.9 C)     Temp Source Oral     SpO2 98 %     Weight 72.6 kg (160 lb)     Height 1.702 m (5\' 7" )     Head Circumference      Peak Flow      Pain Score 10     Pain Loc      Pain Edu?      Excl. in [place]?     Constitutional: Alert and oriented. Well appearing and in no acute distress. Eyes: Chronically opaque pupils/irises secondary to glaucoma and cataracts, patient reports no change from normal. Head:  Atraumatic. Nose: No congestion/rhinnorhea. Mouth/Throat: Mucous membranes are moist. Neck: No stridor.  No meningeal signs.   Cardiovascular: Normal rate, regular rhythm. Good peripheral circulation. Grossly normal heart sounds. Respiratory: Normal respiratory effort.  No retractions. Lungs CTAB. Gastrointestinal: Soft and nontender. No distention.  Musculoskeletal: No lower extremity tenderness nor edema. No gross deformities of extremities. Neurologic:  Normal speech and language. No gross focal neurologic deficits are appreciated.  Skin:  Skin is warm, dry and intact. No rash noted. Psychiatric: Mood and affect are normal. Speech and behavior are normal.  ____________________________________________   LABS (all labs ordered are listed, but only abnormal results  are displayed)  Labs Reviewed  CBC WITH DIFFERENTIAL/PLATELET - Abnormal; Notable for the following components:      Result Value   WBC 15.2 (*)    Neutro Abs 11.6 (*)    All other components within normal limits  BASIC METABOLIC PANEL - Abnormal; Notable for the following components:   Sodium 132 (*)    Potassium 3.0 (*)    Chloride 95 (*)    Glucose, Bld 165 (*)    BUN 25 (*)    Creatinine, Ser 1.77 (*)    GFR calc non Af Amer 45 (*)    GFR calc Af Amer 52 (*)    All other components within normal limits   ____________________________________________  EKG  None - EKG not ordered by ED physician ____________________________________________  RADIOLOGY   ED MD interpretation: No indication for imaging  Official radiology report(s): No results found.  ____________________________________________   PROCEDURES  Critical Care performed: No   Procedure(s) performed:   Procedures   ____________________________________________   INITIAL IMPRESSION / ASSESSMENT AND PLAN / ED COURSE  As part of my medical decision making, I reviewed the following data within the [place]  notes reviewed and incorporated, Labs reviewed , Old chart reviewed and Notes from prior ED visits    Differential diagnosis includes, but is not limited to, uncontrolled essential hypertension, hypertensive urgency/emergency, renal dysfunction, endorgan dysfunction secondary to hypertension.  The patient obviously has very difficult to control hypertension as he is on 6 different outpatient medications for hypertension control.  He has not been taking his spironolactone recently and I think that the hypertension we are currently seeing has been gradual in onset.  I had my usual and customary discussion of medication and emergency department treatment of hypertension with the patient and his wife and explained that there was no indication to give IV medication and that in fact I could cause new symptoms were I to treat his blood pressure without a strong indication to do so.  He is asymptomatic at this time.  I will check a basic metabolic panel and a CBC and if his kidney function is stable and consistent with prior results, I anticipate discharge and outpatient follow-up with his primary care provider as there is no evidence of any acute or emergent condition at this time.  Clinical Course as of Feb 01 243  Tue Jan 31, 2018  0244 The patient's metabolic panel was reassuring.  His potassium is low at 3.0 and I gave him a 40 mill equivalent oral supplement as well as a prescription for about a week's worth of 20 mEq supplements.  He needs to follow-up with his primary care doctor for repeat lab work after this as well as to have another discussion about his blood pressure.  His creatinine is 1.77 but looking back through the record this is within the range of normal for him.  Leukocytosis of 15 is nonspecific and not clinically relevant at this time.I provided reassurance and encouraged the patient to follow-up with his PCP after having a chance to be on all of his medications including the spironolactone  he just restarted today.  He and his wife understand and agree with the plan and I gave my usual and customary return precautions.   [CF]    Clinical Course User Index [CF] Hinda Kehr, MD    ____________________________________________  FINAL CLINICAL IMPRESSION(S) / ED DIAGNOSES  Final diagnoses:  Essential hypertension  Hypokalemia     MEDICATIONS  GIVEN DURING THIS VISIT:  Medications  potassium chloride SA (K-DUR,KLOR-CON) CR tablet 40 mEq (40 mEq Oral Given 01/31/18 0209)     ED Discharge Orders         Ordered    potassium chloride SA (KLOR-CON M20) 20 MEQ tablet  Daily     01/31/18 0244           Note:  This document was prepared using Dragon voice recognition software and may include unintentional dictation errors.    Hinda Kehr, MD 01/31/18 [phone]

## 2018-01-31 NOTE — Discharge Instructions (Signed)

## 2018-03-10 ENCOUNTER — Other Ambulatory Visit: Payer: Self-pay | Admitting: Nephrology

## 2018-03-10 ENCOUNTER — Other Ambulatory Visit (HOSPITAL_COMMUNITY): Payer: Self-pay | Admitting: Nephrology

## 2018-03-10 DIAGNOSIS — N183 Chronic kidney disease, stage 3 unspecified: Secondary | ICD-10-CM

## 2018-03-16 ENCOUNTER — Other Ambulatory Visit: Payer: Self-pay

## 2018-03-16 ENCOUNTER — Ambulatory Visit
Admission: RE | Admit: 2018-03-16 | Discharge: 2018-03-16 | Disposition: A | Discharge status: Home / Self Care | Payer: Medicare Other | Source: Ambulatory Visit | Attending: Nephrology | Admitting: Nephrology

## 2018-03-16 DIAGNOSIS — N183 Chronic kidney disease, stage 3 unspecified: Secondary | ICD-10-CM

## 2018-06-17 IMAGING — NM NM HEPATO W/GB/PHARM/[PERSON_NAME]
2 series · 12 of 12 positions shown · non-contrast
Comparison: Ultrasound 01/25/2017, ultrasound abdomen 09/18/2009

CLINICAL DATA: Gallstones right upper quadrant pain intolerant
fatty foods

EXAM:
NUCLEAR MEDICINE HEPATOBILIARY IMAGING WITH GALLBLADDER EF
TECHNIQUE: Sequential images of the abdomen were obtained [DATE] minutes
following intravenous administration of radiopharmaceutical. After
oral ingestion of Ensure, gallbladder ejection fraction was
determined. At 60 min, normal ejection fraction is greater than 33%.
RADIOPHARMACEUTICALS:  5.43 mCi Pc-AAm  Choletec IV

[he hepatobiliary · 3.10mm/px · 6 of 60 frames shown (1 of 2)]
[frame 6/60]
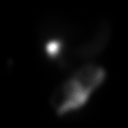
[frame 16/60]
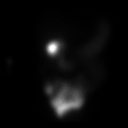
[frame 26/60]
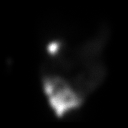
[frame 36/60]
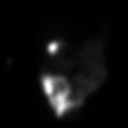
[frame 46/60]
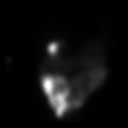
[frame 56/60]
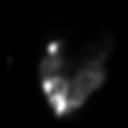

[he hepatobiliary · 3.10mm/px · 6 of 60 frames shown (2 of 2)]
[frame 6/60]
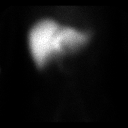
[frame 16/60]
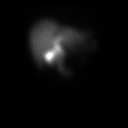
[frame 26/60]
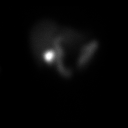
[frame 36/60]
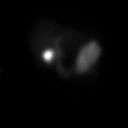
[frame 46/60]
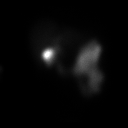
[frame 56/60]
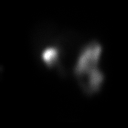

[12 of 12 positions shown; findings below may reference images not displayed]

FINDINGS: Prompt uptake and biliary excretion of activity by the liver is
seen. Gallbladder activity is visualized, consistent with patency of
cystic duct. Biliary activity passes into small bowel, consistent
with patent common bile duct.

Calculated gallbladder ejection fraction is 61%. (Normal gallbladder
ejection fraction with Ensure is greater than 33%.)
IMPRESSION: Negative study

## 2019-09-11 ENCOUNTER — Ambulatory Visit: Payer: Self-pay | Admitting: General Surgery

## 2019-09-11 NOTE — H&P (Signed)
PATIENT PROFILE: Christopher Mcdaniel. is a 49 y.o. male who presents to the Clinic for consultation at the request of Dr. Elijio Miles for evaluation of inguinal hernia.  PCP:  Pernell Dupre, MD  HISTORY OF PRESENT ILLNESS: Christopher Mcdaniel reports feeling in hernia since a year ago. He reports discomfort mostly on the right groin. The pain does not radiate to other part of body. There is no alleviating or aggravating factor. The patient denies any abdominal distention nausea or vomiting. The patient also reports some discomfort on the left groin. Patient reportedly able to reduce the hernia when he is laying down.  Patient denies any previous hernia surgery.   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   Cystoid macular edema of right eye 05/17/2014   Failed corneal transplant 05/17/2014   Essential hypertension 05/17/2014   Hypotony of eye - Right Eye 05/17/2014   Aphakia, right 05/17/2014   CME (cystoid macular edema) 03/30/2013   Serous macular detachment of left eye 03/30/2013   HTN (hypertension) 09/24/2011   Overview    Monitors at home treated with medication      POAG (primary open-angle glaucoma) 08/31/2011   Panuveitis Unknown   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   Mature cataract left eye 08/30/2011      GENERAL REVIEW OF SYSTEMS:   General ROS: negative for - chills, fatigue, fever, weight gain or weight loss Allergy and Immunology ROS: negative for - hives  Hematological and Lymphatic ROS: negative for - bleeding problems or  bruising, negative for palpable nodes Endocrine ROS: negative for - heat or cold intolerance, hair changes Respiratory ROS: negative for - cough, shortness of breath or wheezing Cardiovascular ROS: no chest pain or palpitations GI ROS: negative for nausea, vomiting, abdominal pain, diarrhea, constipation Musculoskeletal ROS: negative for - joint swelling or muscle pain Neurological ROS: negative for - confusion, syncope Dermatological ROS: negative for pruritus and rash Psychiatric: negative for anxiety, depression, difficulty sleeping and memory loss  MEDICATIONS: Current Medications        Current Outpatient Medications  Medication Sig Dispense Refill  . acetaminophen (TYLENOL) 325 MG tablet Take 650 mg by mouth every 4 (four) hours as needed for Pain    . carvediloL (COREG) 25 MG tablet Take 1 tablet by mouth 2 (two) times daily    . chlorthalidone 50 MG tablet Take 50 mg by mouth every morning.      . cloNIDine (CATAPRES-TTS) 0.2 mg/24 hr patch Place 1 patch onto the skin once a week    . niFEdipine (ADALAT CC) 90 MG ER tablet Take 1 tablet by mouth once daily    . ondansetron (ZOFRAN-ODT) 4 MG disintegrating tablet Take 1 tablet by mouth every 4 (four) hours as needed.  0  . pantoprazole (PROTONIX) 40 MG DR tablet TAKE 1 TABLET (40 MG TOTAL) BY MOUTH 2 (TWO) TIMES DAILY. TAKE 30 MIN BEFORE MEALS. 60 tablet 1  . spironolactone (ALDACTONE) 100 MG tablet Take 1 tablet by mouth 2 (two) times daily  No current facility-administered medications for this visit.      ALLERGIES: Quinapril  PAST MEDICAL HISTORY:     Past Medical History:  Diagnosis Date  . Cataract cortical, senile   . CME (cystoid macular edema) 03/30/2013  . Glaucoma (increased eye pressure)    uveitic glaucoma  . 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 Hypertension   . Kidney disease   . Panuveitis   . PONV (postoperative nausea and vomiting)   . Poor  hypertension control 07/18/2014    PAST SURGICAL HISTORY:      Past Surgical History:  Procedure Laterality Date  . ANTERIOR SEGMENT EYE SURGERY Right 06/29/2007   fluocinolone implant OD  . CORNEAL EYE SURGERY Right 01/21/2011   PK OD   . CORNEAL TRANSPLANT PKP PSEUDOPHAKIC Right 06/26/2015   Procedure: CORNEAL TRANSPLANT PKP PSEUDOPHAKIC;  Surgeon: Tomma Rakers, MD;  Location: [place];  Service: Ophthalmology;  Laterality: Right;  Tissue ordered 05/21/15 MIS, ch  . DESTRUCTION CILIARY BODY BY CYCLOPHOTOCOAGULATION Right 07/19/2014   Procedure: Right eye micropulse (TDC) ;  Surgeon: Verneita Griffes, MD;  Location: [place];  Service: Ophthalmology;  Laterality: Right;  PAT phone call [phone]  . GLAUCOMA EYE SURGERY Right 03/16/2004   [place] x 2  . GLAUCOMA EYE SURGERY Right    PI   . GLAUCOMA EYE SURGERY Right 05/24/2011   revisionof aqueous shunt   . IMPLANTATION INTRAVITREAL DRUG DELIVERY SYSTEM Right 06/26/2015   Procedure: IMPLANTATION OF INTRAVITREAL DRUG DELIVERY SYSTEM (EG, GANCICLOVIR IMPLANT), INCLUDES CONCOMITANT REMOVAL OF VITREOUS;  Surgeon: Nicholaus Bloom, MD;  Location: [place];  Service: Ophthalmology;  Laterality: Right;  . LENS EYE SURGERY Right    unknown  . LENS EYE SURGERY Right 06/16/2007   Membranectomy, IOL removal, and retisert implant OD   . REMOVAL SECONDARY MEMBRANOUS CATARACT Right   . REPAIR OF FRACTURED LT KNEE  1988  . TUBE REVISION W/SCLERA & AMNIOTIC MEMBRANE GRAFTING Right 09/27/2011   Procedure: TUBE REVISION W/SCLERA & AMNIOTIC MEMBRANE GRAFTING;  Surgeon: Verneita Griffes, MD;  Location: [place];  Service: Ophthalmology;  Laterality: Right;  . VITREOUS RETINAL SURGERY Right 06/16/2007   Membranectomy, IOL removal, and retisert implant OD  . VITREOUS RETINAL SURGERY Right 06/13/2006   ant and post membranectomy and PPV      FAMILY HISTORY:      Family History  Problem Relation Age of Onset  . No Known Problems  Mother   . High blood pressure (Hypertension) Father   . No Known Problems Sister   . No Known Problems Brother   . Cataracts Maternal Grandmother   . High blood pressure (Hypertension) Paternal Grandmother   . High blood pressure (Hypertension) Paternal Grandfather   . Glaucoma Neg Hx   . Macular degeneration Neg Hx   . Diabetes Neg Hx   . Blindness Neg Hx   . Anesthesia problems Neg Hx   . Malignant hyperthermia Neg Hx      SOCIAL HISTORY: Social History          Socioeconomic History  . Marital status: Single    Spouse name: Not on file  . Number of children: Not on file  . Years of education: Not on file  . Highest education level: Not on file  Occupational History  . Not on file  Tobacco Use  . Smoking status: Never Smoker  . Smokeless tobacco: Never Used  Vaping Use  . Vaping  Use: Never used  Substance and Sexual Activity  . Alcohol use: No    Alcohol/week: 0.0 standard drinks  . Drug use: No  . Sexual activity: Not on file  Other Topics Concern  . Not on file  Social History Narrative   Married with a son   Social Determinants of Health      Financial Resource Strain:   . Difficulty of Paying Living Expenses:   Food Insecurity:   . Worried About Charity fundraiser in the Last Year:   . Arboriculturist in the Last Year:   Transportation Needs:   . Film/video editor (Medical):   Marland Kitchen Lack of Transportation (Non-Medical):       PHYSICAL EXAM:    Vitals:   09/11/19 1059  BP: (!) 158/105  Pulse: 83   Body mass index is 24.5 kg/m. Weight: 70.9 kg (156 lb 6.4 oz)   GENERAL: Alert, active, oriented x3  HEENT: Pupils equal reactive to light. Extraocular movements are intact. Sclera clear. Palpebral conjunctiva normal red color.Pharynx clear.  NECK: Supple with no palpable mass and no adenopathy.  LUNGS: Sound clear with no rales rhonchi or wheezes.  HEART: Regular rhythm S1 and S2 without murmur.  ABDOMEN:  Soft and depressible, nontender with no palpable mass, no hepatomegaly. Bilateral inguinal hernia bigger on the right side. Both are reducible.  EXTREMITIES: Well-developed well-nourished symmetrical with no dependent edema.  NEUROLOGICAL: Awake alert oriented, facial expression symmetrical, moving all extremities.  REVIEW OF DATA: I have reviewed the following data today:      Office Visit on 09/11/2019  Component Date Value  . WBC (White Blood Cell Co* 09/11/2019 11.1*  . RBC (Red Blood Cell Coun* 09/11/2019 4.66*  . Hemoglobin 09/11/2019 13.9*  . Hematocrit 09/11/2019 41.7   . MCV (Mean Corpuscular Vo* 09/11/2019 89.5   . MCH (Mean Corpuscular He* 09/11/2019 29.8   . MCHC (Mean Corpuscular H* 09/11/2019 33.3   . Platelet Count 09/11/2019 183   . RDW-CV (Red Cell Distrib* 09/11/2019 15.1*  . Neutrophils 09/11/2019 7.32   . Lymphocytes 09/11/2019 2.57   . Monocytes 09/11/2019 0.84   . Eosinophils 09/11/2019 0.28   . Basophils 09/11/2019 0.05   . Neutrophil % 09/11/2019 65.8   . Lymphocyte % 09/11/2019 23.1   . Monocyte % 09/11/2019 7.6   . Eosinophil % 09/11/2019 2.5   . Basophil% 09/11/2019 0.5   . Immature Granulocyte % 09/11/2019 0.5   . Immature Granulocyte Cou* 09/11/2019 0.05      ASSESSMENT: Christopher Mcdaniel is a 49 y.o. male presenting for consultation for bilateral inguinal hernia.    The patient presents with a symptomatic, reducible bilateral inguinal hernia. Patient was oriented about the diagnosis of inguinal hernia and its implication. The patient was oriented about the treatment alternatives (observation vs surgical repair). Due to patient symptoms, repair is recommended. Patient oriented about the surgical procedure, the use of mesh and its risk of complications such as: infection, bleeding, injury to vas deference, vasculature and testicle, injury to bowel or bladder, and chronic pain.  Non-recurrent bilateral inguinal hernia without obstruction or gangrene  [K40.20]  PLAN: 1. Robotic assisted laparoscopic bilateral inguinal hernia repair with mesh (31497 x2) 2.  CBC, CMP 3.  Avoid taking aspirin 5 days before procedure 4.  Contact us if has any question or concern.  Patient and his fiance verbalized understanding, all questions were answered, and were agreeable with the plan outlined above.    Herbert Pun,  MD  Electronically signed by Herbert Pun, MD

## 2019-10-02 ENCOUNTER — Encounter: Payer: Self-pay | Admitting: Emergency Medicine

## 2019-10-02 ENCOUNTER — Other Ambulatory Visit: Payer: Self-pay

## 2019-10-02 DIAGNOSIS — Z5321 Procedure and treatment not carried out due to patient leaving prior to being seen by health care provider: Secondary | ICD-10-CM | POA: Diagnosis not present

## 2019-10-02 DIAGNOSIS — I10 Essential (primary) hypertension: Secondary | ICD-10-CM | POA: Insufficient documentation

## 2019-10-02 DIAGNOSIS — R040 Epistaxis: Secondary | ICD-10-CM | POA: Diagnosis not present

## 2019-10-02 LAB — CBC
HCT: 33 % — ABNORMAL LOW (ref 39.0–52.0)
Hemoglobin: 11.5 g/dL — ABNORMAL LOW (ref 13.0–17.0)
MCH: 30.2 pg (ref 26.0–34.0)
MCHC: 34.8 g/dL (ref 30.0–36.0)
MCV: 86.6 fL (ref 80.0–100.0)
Platelets: 231 10*3/uL (ref 150–400)
RBC: 3.81 MIL/uL — ABNORMAL LOW (ref 4.22–5.81)
RDW: 15.5 % (ref 11.5–15.5)
WBC: 12 10*3/uL — ABNORMAL HIGH (ref 4.0–10.5)
nRBC: 0 % (ref 0.0–0.2)

## 2019-10-02 LAB — BASIC METABOLIC PANEL
Anion gap: 11 (ref 5–15)
BUN: 25 mg/dL — ABNORMAL HIGH (ref 6–20)
CO2: 29 mmol/L (ref 22–32)
Calcium: 8.5 mg/dL — ABNORMAL LOW (ref 8.9–10.3)
Chloride: 97 mmol/L — ABNORMAL LOW (ref 98–111)
Creatinine, Ser: 2.01 mg/dL — ABNORMAL HIGH (ref 0.61–1.24)
GFR calc Af Amer: 44 mL/min — ABNORMAL LOW (ref >60)
GFR calc non Af Amer: 38 mL/min — ABNORMAL LOW (ref >60)
Glucose, Bld: 160 mg/dL — ABNORMAL HIGH (ref 70–99)
Potassium: 3.1 mmol/L — ABNORMAL LOW (ref 3.5–5.1)
Sodium: 137 mmol/L (ref 135–145)

## 2019-10-02 NOTE — ED Triage Notes (Addendum)
Pt arrived via POV with reports of bloody nose several times, nose not bleeding currently, no hx of bloody nose before.  Pt also reports elevated BP.  Pt took BP meds about 1 hour ago. Pt states he usually takes them in the morning.  SO in triage states the patient has not been taking BP medications properly.  BP at home today was 220s/140s

## 2019-10-02 NOTE — ED Notes (Signed)
D/w Dr. Charna Archer, new orders received for CBC, BMP and EKG. No additional orders needed at this time. No troponin test needed at this time per Dr. Charna Archer.

## 2019-10-03 ENCOUNTER — Other Ambulatory Visit: Payer: Self-pay

## 2019-10-03 ENCOUNTER — Emergency Department (HOSPITAL_BASED_OUTPATIENT_CLINIC_OR_DEPARTMENT_OTHER)
Admission: EM | Admit: 2019-10-03 | Discharge: 2019-10-03 | Disposition: A | Discharge status: Home / Self Care | Payer: Medicare Other | Attending: Emergency Medicine | Admitting: Emergency Medicine

## 2019-10-03 ENCOUNTER — Emergency Department
Admission: EM | Admit: 2019-10-03 | Discharge: 2019-10-03 | Disposition: A | Discharge status: Home / Self Care | Payer: Medicare Other | Attending: Emergency Medicine | Admitting: Emergency Medicine

## 2019-10-03 ENCOUNTER — Encounter (HOSPITAL_BASED_OUTPATIENT_CLINIC_OR_DEPARTMENT_OTHER): Payer: Self-pay | Admitting: Emergency Medicine

## 2019-10-03 DIAGNOSIS — R040 Epistaxis: Secondary | ICD-10-CM | POA: Insufficient documentation

## 2019-10-03 DIAGNOSIS — I129 Hypertensive chronic kidney disease with stage 1 through stage 4 chronic kidney disease, or unspecified chronic kidney disease: Secondary | ICD-10-CM | POA: Insufficient documentation

## 2019-10-03 DIAGNOSIS — N189 Chronic kidney disease, unspecified: Secondary | ICD-10-CM | POA: Diagnosis not present

## 2019-10-03 DIAGNOSIS — Z79899 Other long term (current) drug therapy: Secondary | ICD-10-CM | POA: Diagnosis not present

## 2019-10-03 MED ORDER — OXYMETAZOLINE HCL 0.05 % NA SOLN
1.0000 | Freq: Once | NASAL | Status: AC
  Administered 2019-10-03: 1 via NASAL
  Filled 2019-10-03: qty 30

## 2019-10-03 NOTE — Discharge Instructions (Signed)
Do NOT blow your nose. If nose starts to bleed again, then blow, spray the Afrin and hold pressure for 15-20 minutes (consistently). If bleeding persists, return to the ER OR call ENT. Follow up with ENT.

## 2019-10-03 NOTE — ED Provider Notes (Signed)
[facility] EMERGENCY DEPARTMENT Provider Note   CSN: [id number] Arrival date & time: 10/03/19  2025     History Chief Complaint  Patient presents with  . Epistaxis    Christopher Mcdaniel. is a 49 y.o. male.  49 year old male with complaint of left side nose bleed since last night. Patient initially went to [facility] ER, was found to be hypertensive and given a nose clamp. Patient left after an extensive wait and came in by EMS to this ER today. Denies headache, chest pain, is not anticoagulated. BP has improved with home meds although is still elevated. No active bleeding at this time.        Past Medical History:  Diagnosis Date  . Aphakia, right   . Arthritis    knees  . Cataract cortical, senile, left   . Chronic kidney disease   . CME (cystoid macular edema)   . Complication of anesthesia    wakes up during surgery  . Failed corneal transplant   . GERD (gastroesophageal reflux disease)   . History of hiatal hernia   . Hyperaldosteronism ([place])   . Hypertension   . Hypotony of eye   . Macular degeneration   . Mature cataract   . Panuveitis   . Panuveitis   . POAG (primary open-angle glaucoma)   . Uveitic glaucoma     Patient Active Problem List   Diagnosis Date Noted  . Non-recurrent bilateral inguinal hernia without obstruction or gangrene 03/08/2016  . Macular degeneration 03/04/2016  . Panuveitis 03/04/2016  . Transplanted cornea 10/10/2015  . Age-related nuclear cataract of left eye 06/17/2015  . Cataract cortical, senile, left 06/17/2015  . Hyperaldosteronism ([place]) 05/22/2015  . Hypertension, secondary 05/22/2015  . Aphakia, right 05/17/2014  . Essential hypertension 05/17/2014  . 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 Hypotony of eye 05/17/2014  . CME (cystoid macular edema) 03/30/2013  . HTN (hypertension) 09/24/2011  . POAG (primary open-angle glaucoma) 08/31/2011  . Mature cataract 08/30/2011  . Uveitic glaucoma  08/30/2011    Past Surgical History:  Procedure Laterality Date  . ADRENAL GLAND SURGERY Bilateral 2016   biopsy  . CORNEAL TRANSPLANT    . EYE SURGERY  2017  . fracture repair of left knee    . FRACTURE SURGERY     FRACTURED LEFT KNEE  . glaucoma eye surgery    . lens eye surgery         Family History  Problem Relation Age of Onset  . Allergies Mother   . Hypertension Mother   . Healthy Father   . Cancer Neg Hx     Social History   Tobacco Use  . Smoking status: Never Smoker  . Smokeless tobacco: Never Used  Vaping Use  . Vaping Use: Never used  Substance Use Topics  . Alcohol use: No  . Drug use: No    Home Medications Prior to Admission medications   Medication Sig Start Date End Date Taking? Authorizing Provider  Ascorbic Acid (VITAMIN C GUMMIE PO) Take 2 tablets by mouth daily.    [provider]  carvedilol (COREG) 25 MG tablet Take 25 mg by mouth 2 (two) times daily with a meal.     [provider]  chlorthalidone (HYGROTON) 50 MG tablet Take 50 mg by mouth daily.     [provider]  cholecalciferol (VITAMIN D3) 25 MCG (1000 UNIT) tablet Take 1,000 Units by mouth daily.    [provider]  cloNIDine (CATAPRES - DOSED IN MG/24 HR) 0.2 mg/24hr patch Place 0.4 mg onto the skin once a week.  01/30/18   [provider]  ELDERBERRY PO Take 2 tablets by mouth daily.    [provider]  NIFEdipine (ADALAT CC) 90 MG 24 hr tablet Take 90 mg by mouth daily.     [provider]  ondansetron (ZOFRAN) 4 MG tablet Take 4 mg by mouth every 8 (eight) hours as needed for nausea or vomiting.    [provider]  pantoprazole (PROTONIX) 40 MG tablet Take 40 mg by mouth 2 (two) times daily.    [provider]  potassium chloride SA (KLOR-CON M20) 20 MEQ tablet Take 1 tablet (20 mEq total) by mouth daily. Patient not taking: Reported on 09/25/2019 01/31/18   Hinda Kehr, MD  spironolactone  (ALDACTONE) 100 MG tablet Take 100 mg by mouth 2 (two) times daily.  02/18/16 09/25/19  [provider]    Allergies    Ace inhibitors and Quinapril  Review of Systems   Review of Systems  Constitutional: Negative for fever.  HENT: Positive for nosebleeds.   Respiratory: Negative for shortness of breath.   Cardiovascular: Negative for chest pain.  Skin: Negative for rash and wound.  Allergic/Immunologic: Negative for immunocompromised state.  Neurological: Negative for headaches.  Hematological: Does not bruise/bleed easily.  All other systems reviewed and are negative.   Physical Exam Updated Vital Signs BP (!) 159/109   Pulse 87   Temp 98.2 F (36.8 C) (Oral)   Resp 18   SpO2 100%   Physical Exam Vitals and nursing note reviewed.  Constitutional:      General: He is not in acute distress.    Appearance: He is well-developed. He is not diaphoretic.  HENT:     Head: Normocephalic and atraumatic.     Nose:     Left Nostril: Epistaxis present.     Comments: Large clot in left nares, no active bleeding visible, posterior oropharynx clear    Mouth/Throat:     Mouth: Mucous membranes are moist.     Pharynx: Oropharynx is clear.  Pulmonary:     Effort: Pulmonary effort is normal.  Musculoskeletal:     Cervical back: Neck supple.  Skin:    General: Skin is warm and dry.  Neurological:     Mental Status: He is alert and oriented to person, place, and time.  Psychiatric:        Behavior: Behavior normal.     ED Results / Procedures / Treatments   Labs (all labs ordered are listed, but only abnormal results are displayed) Labs Reviewed - No data to display  EKG None  Radiology No results found.  Procedures .Epistaxis Management  Date/Time: 10/03/2019 10:24 AM Performed by: Tacy Learn, PA-C Authorized by: Tacy Learn, PA-C   Consent:    Consent obtained:  Verbal   Consent given by:  Patient   Risks discussed:  Bleeding, nasal injury, pain  and infection   Alternatives discussed:  No treatment Anesthesia (see MAR for exact dosages):    Anesthesia method:  None Procedure details:    Treatment site:  L anterior   Repair method: afrin, pressure.   Treatment complexity:  Limited   Treatment episode: initial   Post-procedure details:    Assessment:  Bleeding stopped   Patient tolerance of procedure:  Tolerated well, no immediate complications   (including critical care time)  Medications Ordered in ED Medications  oxymetazoline (AFRIN) 0.05 % nasal spray 1 spray (1 spray Each Nare Given by Other 10/03/19 0930)    ED Course  I have reviewed the triage vital signs and the nursing notes.  Pertinent labs & imaging results that were available during my care of the patient were reviewed by me and considered in my medical decision making (see chart for details).  Clinical Course as of Oct 03 1031  Wed Oct 03, 2019  1028 49 yo  male with history of CKD and HTN presents with left side nose bleed since last night. On exam, found to have elevated BP although improved after meds compared to prior at previous ER where pt left without being seen. Large clot in left nares. Patient blew his nose, removing the large clot no significant bleeding afterwards, afrin applied to both sides and pressure held for 20 minutes. No further bleeding. Monitored for 30 minutes, no further bleeding. Discussed home care, ENT referral given. Return precautions given.    [LM]    Clinical Course User Index [LM] Roque Lias   MDM Rules/Calculators/A&P                          Final Clinical Impression(s) / ED Diagnoses Final diagnoses:  Epistaxis    Rx / DC Orders ED Discharge Orders    None       Roque Lias 10/03/19 1033    Gareth Morgan, MD 10/04/19 1054

## 2019-10-03 NOTE — ED Triage Notes (Signed)
EMS Report recurrent epistaxis, Hx HTN. Controlled bleeding prior to arrival . A & O x 4

## 2019-10-04 ENCOUNTER — Encounter
Admission: RE | Admit: 2019-10-04 | Discharge: 2019-10-04 | Disposition: A | Discharge status: Home / Self Care | Payer: Medicare Other | Source: Ambulatory Visit | Attending: General Surgery | Admitting: General Surgery

## 2019-10-04 DIAGNOSIS — Z01812 Encounter for preprocedural laboratory examination: Secondary | ICD-10-CM | POA: Insufficient documentation

## 2019-10-04 NOTE — Patient Instructions (Signed)
Your procedure is scheduled on: 09/29.21 Report to Day Surgery on the 2nd floor of the [facility]. To find out your arrival time, please call [phone] between 1PM - 3PM on: 10/09/19  REMEMBER: Instructions that are not followed completely may result in serious medical risk, up to and including death; or upon the discretion of your surgeon and anesthesiologist your surgery may need to be rescheduled.  Do not eat food after midnight the night before surgery.  No gum chewing, lozengers or hard candies.  You may however, drink CLEAR liquids up to 2 hours before you are scheduled to arrive for your surgery. Do not drink anything within 2 hours of your scheduled arrival time.  Clear liquids include: - water  - apple juice without pulp - gatorade (not RED) - black coffee or tea (Do NOT add milk or creamers to the coffee or tea) Do NOT drink anything that is not on this list.  Type 1 and Type 2 diabetics should only drink water.    TAKE THESE MEDICATIONS THE  MORNING OF SURGERY WITH A SIP OF WATER:  - carvedilol (COREG) 25 MG tablet - NIFEdipine (ADALAT CC) 90 MG 24 hr tablet - cloNIDine (CATAPRES - DOSED IN MG/24 HR) 0.2 mg/24hr patch -pantoprazole (PROTONIX) 40 MG tablet , take one the night before and one on the morning of surgery - helps to prevent nausea after surgery.)  Do not take the morning of surgery:  - chlorthalidone (HYGROTON) 50 MG tablet - spironolactone (ALDACTONE) 100 MG tablet   One week prior to surgery: Stop Anti-inflammatories (NSAIDS) such as Advil, Aleve, Ibuprofen, Motrin, Naproxen, Naprosyn and Aspirin based products such as Excedrin, Goodys Powder, BC Powder. Stop ANY OVER THE COUNTER supplements until after surgery, Elderberry and Vitamin C. (You may continue taking Vitamin D).  No Alcohol for 24 hours before or after surgery.  No Smoking including e-cigarettes for 24 hours prior to surgery.  No chewable tobacco products for at least 6 hours  prior to surgery.  No nicotine patches on the day of surgery.  Do not use any "recreational" drugs for at least a week prior to your surgery.  Please be advised that the combination of cocaine and anesthesia may have negative outcomes, up to and including death. If you test positive for cocaine, your surgery will be cancelled.  On the morning of surgery brush your teeth with toothpaste and water, you may rinse your mouth with mouthwash if you wish. Do not swallow any toothpaste or mouthwash.  Do not wear jewelry, make-up, hairpins, clips or nail polish.  Do not wear lotions, powders, or perfumes.   Do not shave 48 hours prior to surgery.   Contact lenses, hearing aids and dentures may not be worn into surgery.  Do not bring valuables to the hospital. [facility] is not responsible for any missing/lost belongings or valuables.   Use CHG Soap as directed on instruction sheet.    Notify your doctor if there is any change in your medical condition (cold, fever, infection).  Wear comfortable clothing (specific to your surgery type) to the hospital.  Plan for stool softeners for home use; pain medications have a tendency to cause constipation. You can also help prevent constipation by eating foods high in fiber such as fruits and vegetables and drinking plenty of fluids as your diet allows.  After surgery, you can help prevent lung complications by doing breathing exercises.  Take deep breaths and cough every 1-2 hours. Your doctor may  order a device called an Incentive Spirometer to help you take deep breaths. When coughing or sneezing, hold a pillow firmly against your incision with both hands. This is called "splinting." Doing this helps protect your incision. It also decreases belly discomfort.  If you are being admitted to the hospital overnight, leave your suitcase in the car. After surgery it may be brought to your room.  If you are being discharged the day of surgery, you will  not be allowed to drive home. You will need a responsible adult (18 years or older) to drive you home and stay with you that night.   If you are taking public transportation, you will need to have a responsible adult (18 years or older) with you. Please confirm with your physician that it is acceptable to use public transportation.   Please call the [facility] at [phone] if you have any questions about these instructions.  Visitation Policy:  Patients undergoing a surgery or procedure may have one family member or support person with them as long as that person is not COVID-19 positive or experiencing its symptoms.  That person may remain in the waiting area during the procedure.  Inpatient Visitation Update:   In an effort to ensure the safety of our team members and our patients, we are implementing a change to our visitation policy:  Effective Monday, Aug. 9, at 7 a.m., inpatients will be allowed one support person.  o The support person may change daily.  o The support person must pass our screening, gel in and out, and wear a mask at all times, including in the patient's room.  o Patients must also wear a mask when staff or their support person are in the room.  o Masking is required regardless of vaccination status.  Systemwide, no visitors 17 or younger.

## 2019-10-08 ENCOUNTER — Other Ambulatory Visit: Admission: RE | Admit: 2019-10-08 | Payer: Medicare Other | Source: Ambulatory Visit

## 2019-10-10 ENCOUNTER — Encounter: Admission: RE | Payer: Self-pay | Source: Ambulatory Visit

## 2019-10-10 ENCOUNTER — Ambulatory Visit: Admission: RE | Admit: 2019-10-10 | Payer: Medicare Other | Source: Ambulatory Visit | Admitting: General Surgery

## 2019-10-10 SURGERY — REPAIR, HERNIA, INGUINAL, ROBOT-ASSISTED, LAPAROSCOPIC, USING MESH
Anesthesia: General | Site: Groin | Laterality: Bilateral

## 2019-10-20 ENCOUNTER — Ambulatory Visit: Payer: Self-pay | Admitting: General Surgery

## 2019-10-22 ENCOUNTER — Other Ambulatory Visit
Admission: RE | Admit: 2019-10-22 | Discharge: 2019-10-22 | Disposition: A | Discharge status: Home / Self Care | Payer: Medicare Other | Source: Ambulatory Visit | Attending: General Surgery | Admitting: General Surgery

## 2019-10-22 NOTE — Progress Notes (Signed)
No show at covid test site today, call to patient, left message.

## 2019-10-23 ENCOUNTER — Other Ambulatory Visit: Admission: RE | Admit: 2019-10-23 | Payer: Medicare Other | Source: Ambulatory Visit

## 2019-10-24 ENCOUNTER — Encounter: Admission: RE | Payer: Self-pay | Source: Ambulatory Visit

## 2019-10-24 ENCOUNTER — Ambulatory Visit: Admission: RE | Admit: 2019-10-24 | Payer: Medicare Other | Source: Ambulatory Visit | Admitting: General Surgery

## 2019-10-24 SURGERY — REPAIR, HERNIA, INGUINAL, ROBOT-ASSISTED, LAPAROSCOPIC, USING MESH
Anesthesia: General | Site: Groin | Laterality: Bilateral

## 2020-01-21 IMAGING — US US RENAL
1 series · 14 of 25 positions shown · non-contrast
Comparison: None.

CLINICAL DATA: Chronic kidney disease, stage III

EXAM:
RENAL / URINARY TRACT ULTRASOUND COMPLETE

[Series 1: us renal · 0.23mm/px · 14 of 46 slices shown]
[im 1/46]
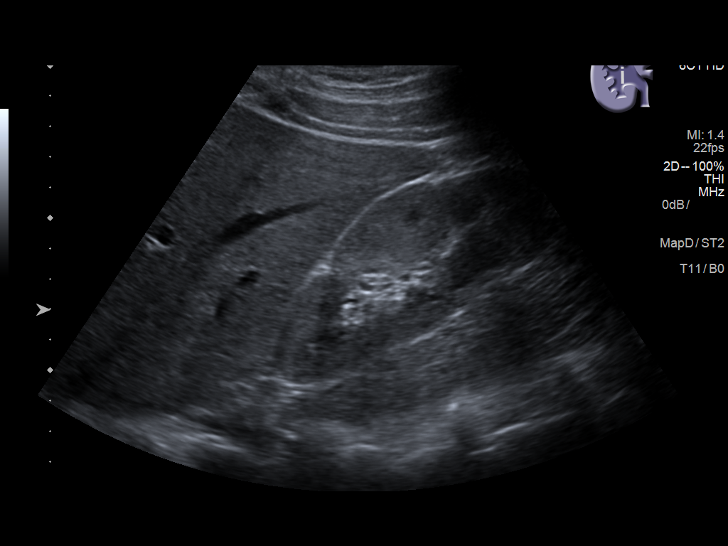
[im 4/46]
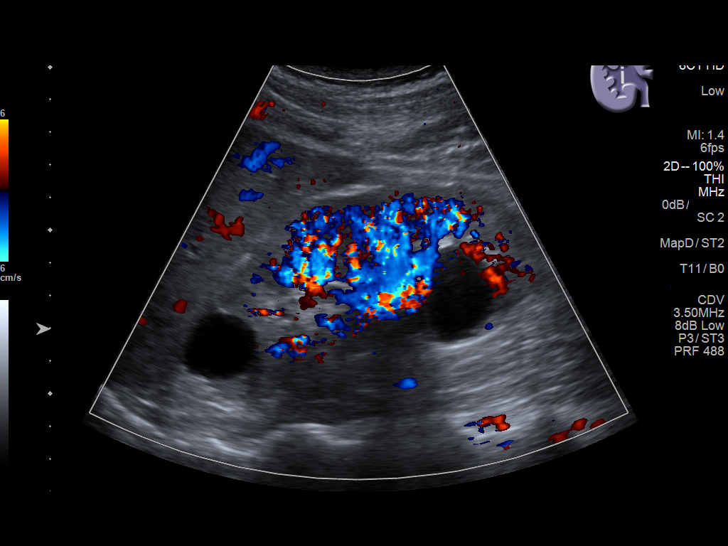
[im 8/46]
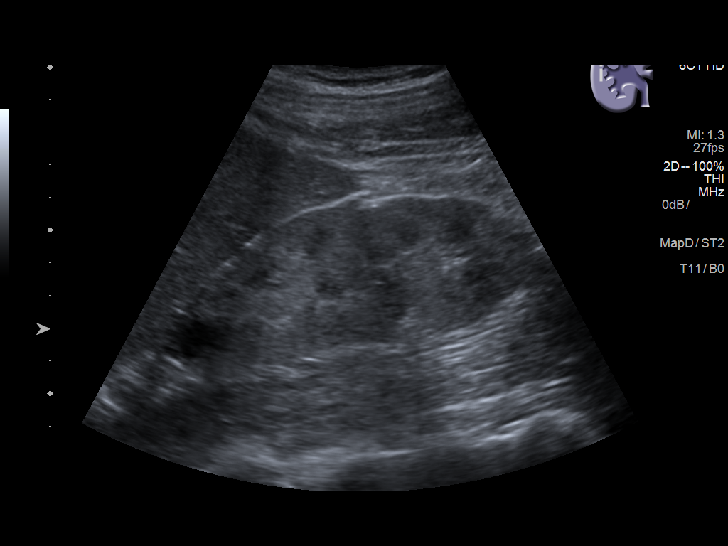
[im 12/46]
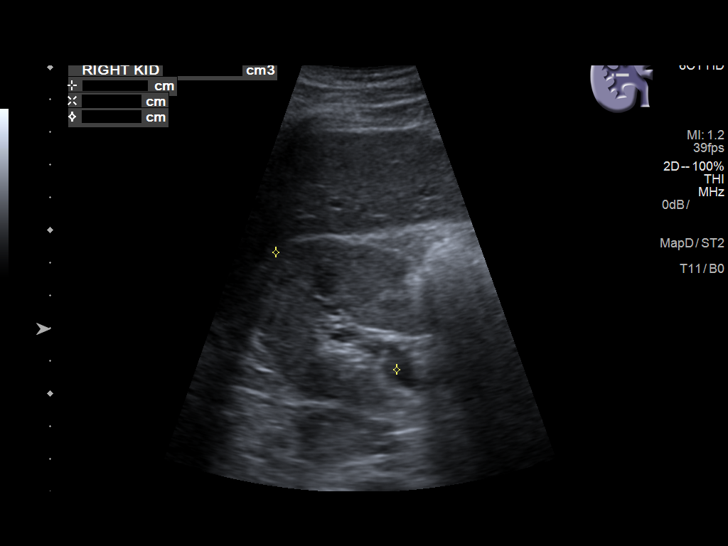
[im 16/46]
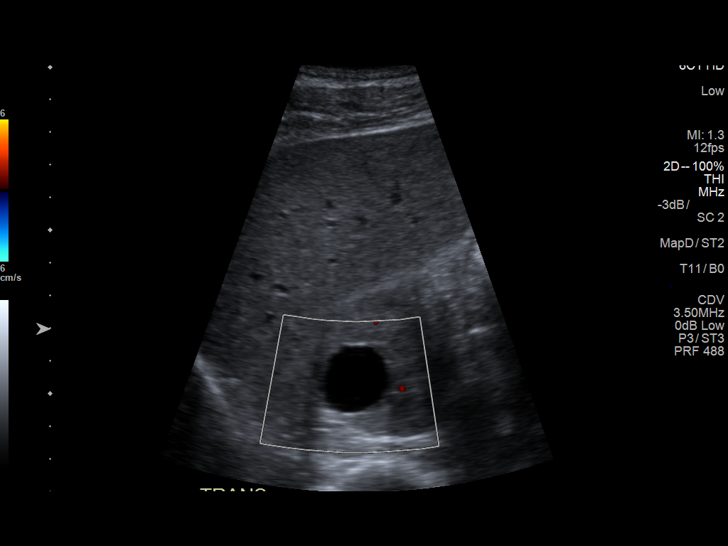
[im 17/46]
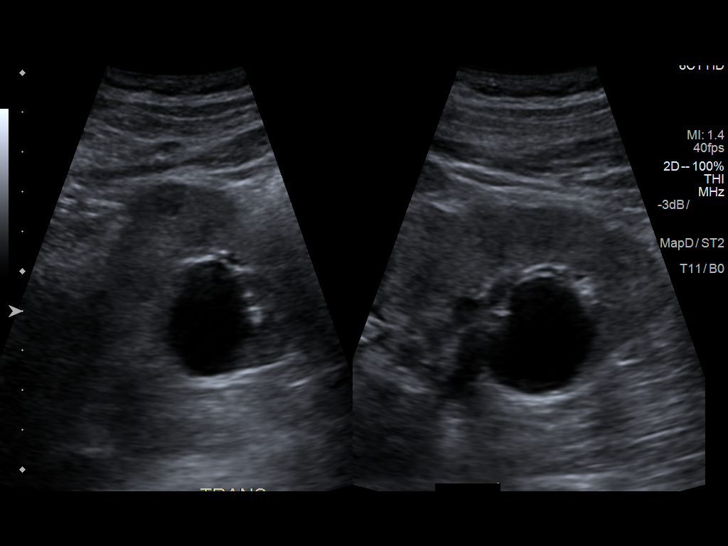
[im 21/46]
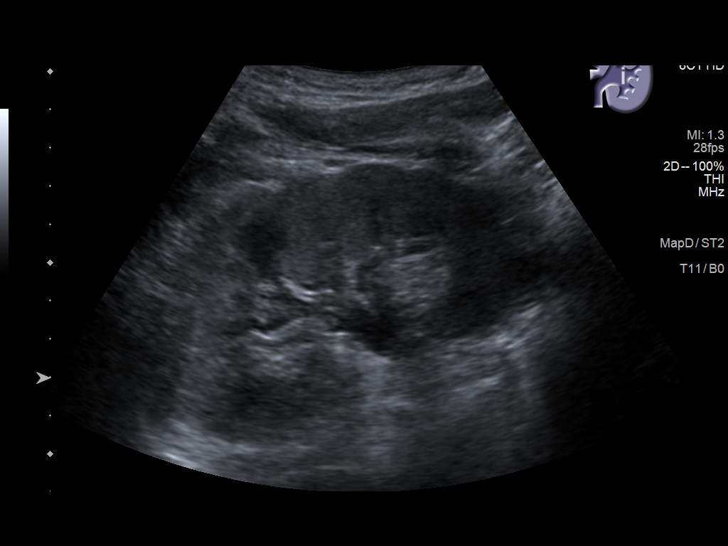
[im 25/46]
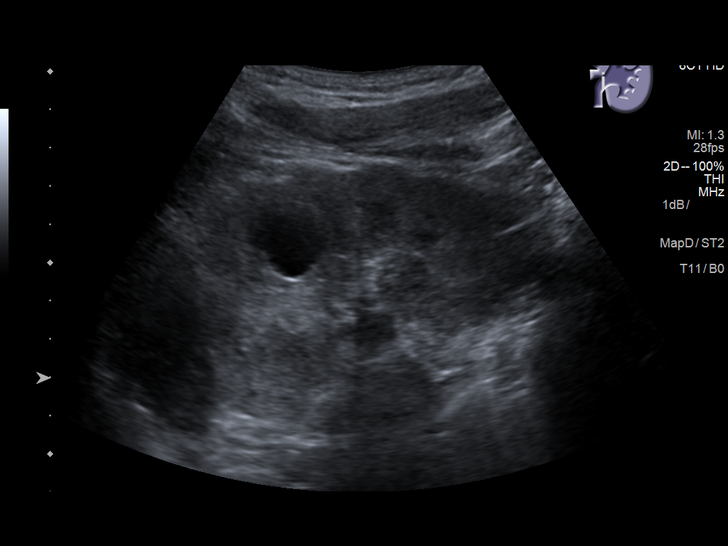
[im 29/46]
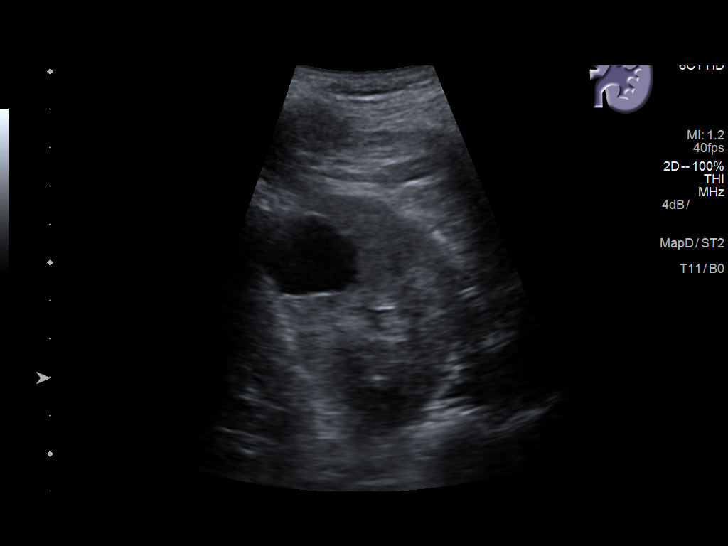
[im 31/46]
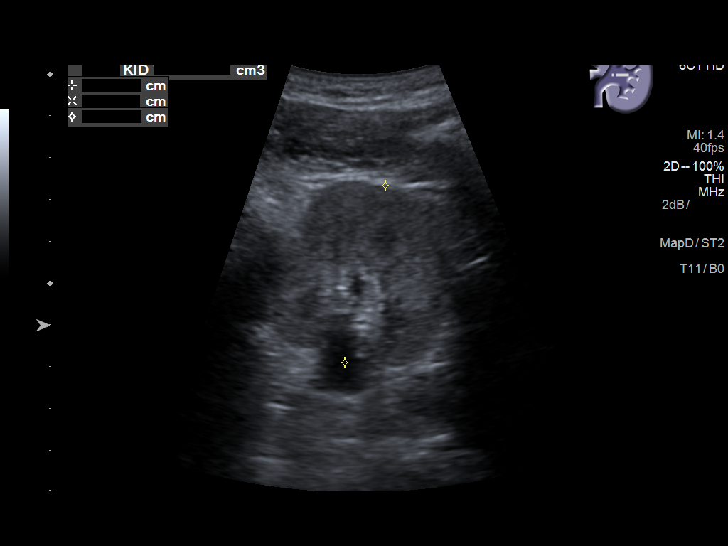
[im 34/46]
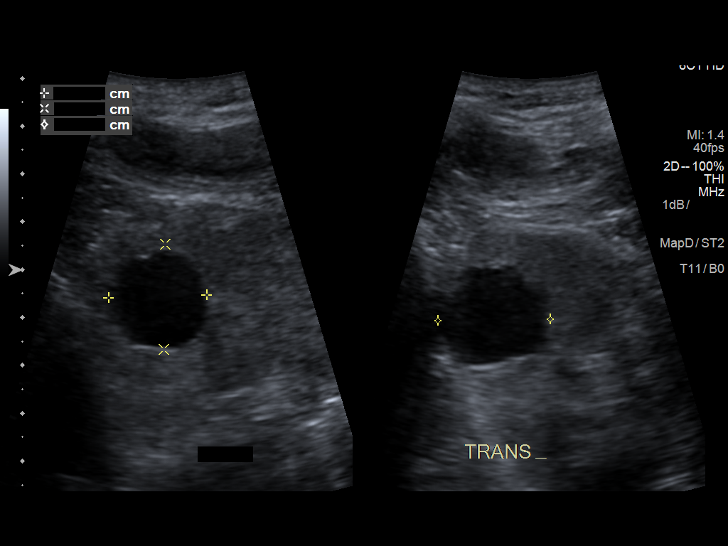
[im 38/46]
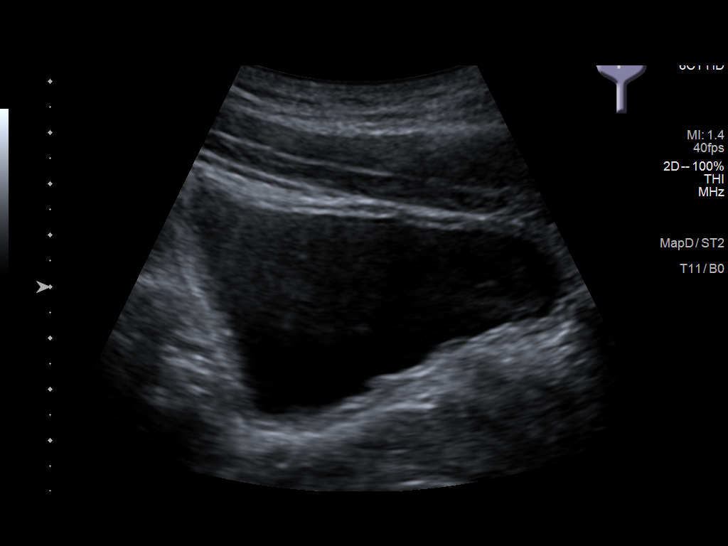
[im 42/46]
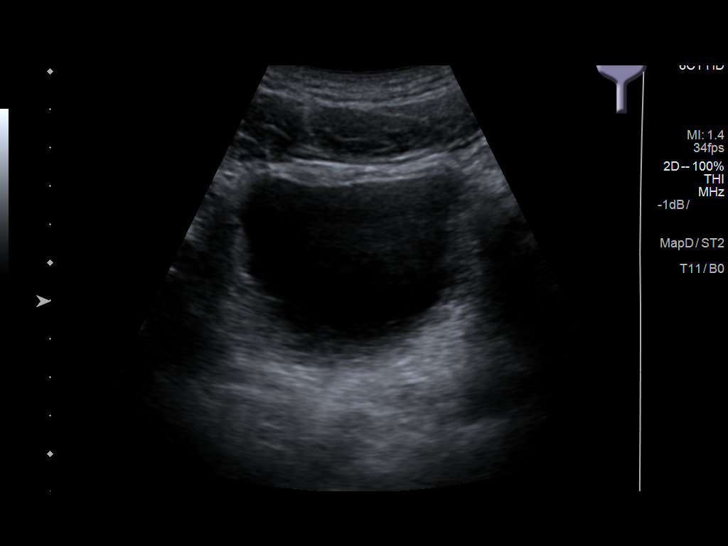
[im 46/46]
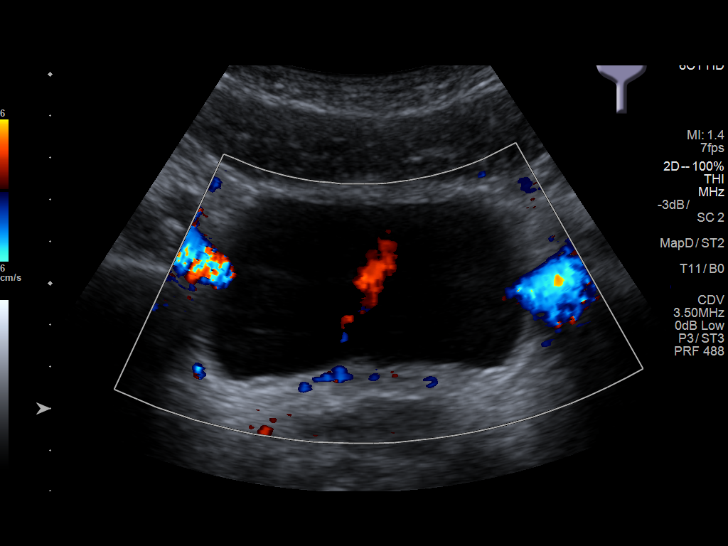

[14 of 25 positions shown; findings below may reference images not displayed]

FINDINGS: Right Kidney:

Renal measurements: 11.7 x 5.7 x 5.2 cm = volume: 181 mL. Echogenic
renal cortex. Multiple cysts, largest measuring 2.6 cm. No
hydronephrosis.

Left Kidney:

Renal measurements: 9.9 x 5.8 x 4.3 cm = volume: 131 mL. Echogenic
renal cortex. LEFT renal cyst measuring 2.4 cm. No hydronephrosis.

Bladder:

Appears normal for degree of bladder distention.
IMPRESSION: 1. Echogenic renal cortices, consistent with the given history of
chronic medical renal disease.
2. Bilateral renal cysts.
3. No acute findings.  No hydronephrosis.

## 2020-06-17 DIAGNOSIS — N189 Chronic kidney disease, unspecified: Secondary | ICD-10-CM | POA: Diagnosis not present

## 2020-06-17 DIAGNOSIS — L732 Hidradenitis suppurativa: Secondary | ICD-10-CM | POA: Diagnosis not present

## 2020-06-17 DIAGNOSIS — K4090 Unilateral inguinal hernia, without obstruction or gangrene, not specified as recurrent: Secondary | ICD-10-CM | POA: Diagnosis not present

## 2020-06-17 DIAGNOSIS — E785 Hyperlipidemia, unspecified: Secondary | ICD-10-CM | POA: Diagnosis not present

## 2020-06-17 DIAGNOSIS — I10 Essential (primary) hypertension: Secondary | ICD-10-CM | POA: Diagnosis not present

## 2020-06-17 DIAGNOSIS — J301 Allergic rhinitis due to pollen: Secondary | ICD-10-CM | POA: Diagnosis not present

## 2020-07-22 DIAGNOSIS — J301 Allergic rhinitis due to pollen: Secondary | ICD-10-CM | POA: Diagnosis not present

## 2020-07-22 DIAGNOSIS — N189 Chronic kidney disease, unspecified: Secondary | ICD-10-CM | POA: Diagnosis not present

## 2020-07-22 DIAGNOSIS — I10 Essential (primary) hypertension: Secondary | ICD-10-CM | POA: Diagnosis not present

## 2020-07-22 DIAGNOSIS — L732 Hidradenitis suppurativa: Secondary | ICD-10-CM | POA: Diagnosis not present

## 2020-07-22 DIAGNOSIS — K4090 Unilateral inguinal hernia, without obstruction or gangrene, not specified as recurrent: Secondary | ICD-10-CM | POA: Diagnosis not present

## 2020-07-22 DIAGNOSIS — E785 Hyperlipidemia, unspecified: Secondary | ICD-10-CM | POA: Diagnosis not present

## 2020-07-29 DIAGNOSIS — E269 Hyperaldosteronism, unspecified: Secondary | ICD-10-CM | POA: Diagnosis not present

## 2020-07-29 DIAGNOSIS — I10 Essential (primary) hypertension: Secondary | ICD-10-CM | POA: Diagnosis not present

## 2020-08-06 DIAGNOSIS — E785 Hyperlipidemia, unspecified: Secondary | ICD-10-CM | POA: Diagnosis not present

## 2020-08-12 DIAGNOSIS — E785 Hyperlipidemia, unspecified: Secondary | ICD-10-CM | POA: Diagnosis not present

## 2020-08-12 DIAGNOSIS — H269 Unspecified cataract: Secondary | ICD-10-CM | POA: Diagnosis not present

## 2020-08-12 DIAGNOSIS — N189 Chronic kidney disease, unspecified: Secondary | ICD-10-CM | POA: Diagnosis not present

## 2020-08-12 DIAGNOSIS — K2970 Gastritis, unspecified, without bleeding: Secondary | ICD-10-CM | POA: Diagnosis not present

## 2020-08-12 DIAGNOSIS — I10 Essential (primary) hypertension: Secondary | ICD-10-CM | POA: Diagnosis not present

## 2020-08-12 DIAGNOSIS — L732 Hidradenitis suppurativa: Secondary | ICD-10-CM | POA: Diagnosis not present

## 2020-08-12 DIAGNOSIS — J301 Allergic rhinitis due to pollen: Secondary | ICD-10-CM | POA: Diagnosis not present

## 2020-08-12 DIAGNOSIS — K4090 Unilateral inguinal hernia, without obstruction or gangrene, not specified as recurrent: Secondary | ICD-10-CM | POA: Diagnosis not present

## 2020-08-22 DIAGNOSIS — H4043X3 Glaucoma secondary to eye inflammation, bilateral, severe stage: Secondary | ICD-10-CM | POA: Diagnosis not present

## 2020-08-22 DIAGNOSIS — H2589 Other age-related cataract: Secondary | ICD-10-CM | POA: Diagnosis not present

## 2020-08-22 DIAGNOSIS — H547 Unspecified visual loss: Secondary | ICD-10-CM | POA: Diagnosis not present

## 2020-08-22 DIAGNOSIS — H2701 Aphakia, right eye: Secondary | ICD-10-CM | POA: Diagnosis not present

## 2020-08-22 DIAGNOSIS — H3322 Serous retinal detachment, left eye: Secondary | ICD-10-CM | POA: Diagnosis not present

## 2020-09-04 DIAGNOSIS — N1831 Chronic kidney disease, stage 3a: Secondary | ICD-10-CM | POA: Diagnosis not present

## 2020-09-04 DIAGNOSIS — I10 Essential (primary) hypertension: Secondary | ICD-10-CM | POA: Diagnosis not present

## 2020-09-04 DIAGNOSIS — E269 Hyperaldosteronism, unspecified: Secondary | ICD-10-CM | POA: Diagnosis not present

## 2020-09-04 DIAGNOSIS — N1832 Chronic kidney disease, stage 3b: Secondary | ICD-10-CM | POA: Diagnosis not present

## 2020-09-17 DIAGNOSIS — N189 Chronic kidney disease, unspecified: Secondary | ICD-10-CM | POA: Diagnosis not present

## 2020-09-17 DIAGNOSIS — J301 Allergic rhinitis due to pollen: Secondary | ICD-10-CM | POA: Diagnosis not present

## 2020-09-17 DIAGNOSIS — I10 Essential (primary) hypertension: Secondary | ICD-10-CM | POA: Diagnosis not present

## 2020-09-17 DIAGNOSIS — K4090 Unilateral inguinal hernia, without obstruction or gangrene, not specified as recurrent: Secondary | ICD-10-CM | POA: Diagnosis not present

## 2020-09-17 DIAGNOSIS — E785 Hyperlipidemia, unspecified: Secondary | ICD-10-CM | POA: Diagnosis not present

## 2020-09-17 DIAGNOSIS — K2100 Gastro-esophageal reflux disease with esophagitis, without bleeding: Secondary | ICD-10-CM | POA: Diagnosis not present

## 2020-09-17 DIAGNOSIS — H269 Unspecified cataract: Secondary | ICD-10-CM | POA: Diagnosis not present

## 2020-09-17 DIAGNOSIS — L732 Hidradenitis suppurativa: Secondary | ICD-10-CM | POA: Diagnosis not present

## 2020-09-19 DIAGNOSIS — H35722 Serous detachment of retinal pigment epithelium, left eye: Secondary | ICD-10-CM | POA: Diagnosis not present

## 2020-09-19 DIAGNOSIS — H2701 Aphakia, right eye: Secondary | ICD-10-CM | POA: Diagnosis not present

## 2020-09-19 DIAGNOSIS — H4043X3 Glaucoma secondary to eye inflammation, bilateral, severe stage: Secondary | ICD-10-CM | POA: Diagnosis not present

## 2020-10-01 DIAGNOSIS — H4043X3 Glaucoma secondary to eye inflammation, bilateral, severe stage: Secondary | ICD-10-CM | POA: Diagnosis not present

## 2020-10-01 DIAGNOSIS — H209 Unspecified iridocyclitis: Secondary | ICD-10-CM | POA: Diagnosis not present

## 2020-11-04 DIAGNOSIS — K4090 Unilateral inguinal hernia, without obstruction or gangrene, not specified as recurrent: Secondary | ICD-10-CM | POA: Diagnosis not present

## 2020-11-04 DIAGNOSIS — K2100 Gastro-esophageal reflux disease with esophagitis, without bleeding: Secondary | ICD-10-CM | POA: Diagnosis not present

## 2020-11-04 DIAGNOSIS — K2970 Gastritis, unspecified, without bleeding: Secondary | ICD-10-CM | POA: Diagnosis not present

## 2020-11-04 DIAGNOSIS — I10 Essential (primary) hypertension: Secondary | ICD-10-CM | POA: Diagnosis not present

## 2020-11-04 DIAGNOSIS — J301 Allergic rhinitis due to pollen: Secondary | ICD-10-CM | POA: Diagnosis not present

## 2020-11-04 DIAGNOSIS — N189 Chronic kidney disease, unspecified: Secondary | ICD-10-CM | POA: Diagnosis not present

## 2020-11-04 DIAGNOSIS — E785 Hyperlipidemia, unspecified: Secondary | ICD-10-CM | POA: Diagnosis not present

## 2020-11-04 DIAGNOSIS — L732 Hidradenitis suppurativa: Secondary | ICD-10-CM | POA: Diagnosis not present

## 2020-11-19 DIAGNOSIS — Z947 Corneal transplant status: Secondary | ICD-10-CM | POA: Diagnosis not present

## 2020-12-09 DIAGNOSIS — H4043X3 Glaucoma secondary to eye inflammation, bilateral, severe stage: Secondary | ICD-10-CM | POA: Diagnosis not present

## 2020-12-09 DIAGNOSIS — H209 Unspecified iridocyclitis: Secondary | ICD-10-CM | POA: Diagnosis not present

## 2020-12-23 DIAGNOSIS — Z79899 Other long term (current) drug therapy: Secondary | ICD-10-CM | POA: Diagnosis not present

## 2020-12-23 DIAGNOSIS — E2609 Other primary hyperaldosteronism: Secondary | ICD-10-CM | POA: Diagnosis not present

## 2020-12-23 DIAGNOSIS — H401113 Primary open-angle glaucoma, right eye, severe stage: Secondary | ICD-10-CM | POA: Diagnosis not present

## 2020-12-23 DIAGNOSIS — I129 Hypertensive chronic kidney disease with stage 1 through stage 4 chronic kidney disease, or unspecified chronic kidney disease: Secondary | ICD-10-CM | POA: Diagnosis not present

## 2020-12-23 DIAGNOSIS — H18231 Secondary corneal edema, right eye: Secondary | ICD-10-CM | POA: Diagnosis not present

## 2020-12-23 DIAGNOSIS — N189 Chronic kidney disease, unspecified: Secondary | ICD-10-CM | POA: Diagnosis not present

## 2020-12-23 DIAGNOSIS — K219 Gastro-esophageal reflux disease without esophagitis: Secondary | ICD-10-CM | POA: Diagnosis not present

## 2021-02-04 DIAGNOSIS — N189 Chronic kidney disease, unspecified: Secondary | ICD-10-CM | POA: Diagnosis not present

## 2021-02-04 DIAGNOSIS — E785 Hyperlipidemia, unspecified: Secondary | ICD-10-CM | POA: Diagnosis not present

## 2021-02-04 DIAGNOSIS — K2100 Gastro-esophageal reflux disease with esophagitis, without bleeding: Secondary | ICD-10-CM | POA: Diagnosis not present

## 2021-02-04 DIAGNOSIS — H269 Unspecified cataract: Secondary | ICD-10-CM | POA: Diagnosis not present

## 2021-02-04 DIAGNOSIS — I10 Essential (primary) hypertension: Secondary | ICD-10-CM | POA: Diagnosis not present

## 2021-02-04 DIAGNOSIS — K4090 Unilateral inguinal hernia, without obstruction or gangrene, not specified as recurrent: Secondary | ICD-10-CM | POA: Diagnosis not present

## 2021-02-04 DIAGNOSIS — J301 Allergic rhinitis due to pollen: Secondary | ICD-10-CM | POA: Diagnosis not present

## 2021-02-04 DIAGNOSIS — L732 Hidradenitis suppurativa: Secondary | ICD-10-CM | POA: Diagnosis not present

## 2021-03-25 DIAGNOSIS — J301 Allergic rhinitis due to pollen: Secondary | ICD-10-CM | POA: Diagnosis not present

## 2021-03-25 DIAGNOSIS — I10 Essential (primary) hypertension: Secondary | ICD-10-CM | POA: Diagnosis not present

## 2021-03-25 DIAGNOSIS — H269 Unspecified cataract: Secondary | ICD-10-CM | POA: Diagnosis not present

## 2021-03-25 DIAGNOSIS — H18231 Secondary corneal edema, right eye: Secondary | ICD-10-CM | POA: Diagnosis not present

## 2021-03-25 DIAGNOSIS — K4090 Unilateral inguinal hernia, without obstruction or gangrene, not specified as recurrent: Secondary | ICD-10-CM | POA: Diagnosis not present

## 2021-03-25 DIAGNOSIS — E785 Hyperlipidemia, unspecified: Secondary | ICD-10-CM | POA: Diagnosis not present

## 2021-03-25 DIAGNOSIS — L732 Hidradenitis suppurativa: Secondary | ICD-10-CM | POA: Diagnosis not present

## 2021-03-25 DIAGNOSIS — K2100 Gastro-esophageal reflux disease with esophagitis, without bleeding: Secondary | ICD-10-CM | POA: Diagnosis not present

## 2021-03-25 DIAGNOSIS — N189 Chronic kidney disease, unspecified: Secondary | ICD-10-CM | POA: Diagnosis not present

## 2021-03-26 DIAGNOSIS — E269 Hyperaldosteronism, unspecified: Secondary | ICD-10-CM | POA: Diagnosis not present

## 2021-03-26 DIAGNOSIS — E2609 Other primary hyperaldosteronism: Secondary | ICD-10-CM | POA: Diagnosis not present

## 2021-03-26 DIAGNOSIS — N1832 Chronic kidney disease, stage 3b: Secondary | ICD-10-CM | POA: Diagnosis not present

## 2021-03-26 DIAGNOSIS — I10 Essential (primary) hypertension: Secondary | ICD-10-CM | POA: Diagnosis not present

## 2021-06-02 DIAGNOSIS — H269 Unspecified cataract: Secondary | ICD-10-CM | POA: Diagnosis not present

## 2021-06-02 DIAGNOSIS — K2100 Gastro-esophageal reflux disease with esophagitis, without bleeding: Secondary | ICD-10-CM | POA: Diagnosis not present

## 2021-06-02 DIAGNOSIS — L732 Hidradenitis suppurativa: Secondary | ICD-10-CM | POA: Diagnosis not present

## 2021-06-02 DIAGNOSIS — J301 Allergic rhinitis due to pollen: Secondary | ICD-10-CM | POA: Diagnosis not present

## 2021-06-02 DIAGNOSIS — E785 Hyperlipidemia, unspecified: Secondary | ICD-10-CM | POA: Diagnosis not present

## 2021-06-02 DIAGNOSIS — N189 Chronic kidney disease, unspecified: Secondary | ICD-10-CM | POA: Diagnosis not present

## 2021-06-02 DIAGNOSIS — I10 Essential (primary) hypertension: Secondary | ICD-10-CM | POA: Diagnosis not present

## 2021-06-02 DIAGNOSIS — K4090 Unilateral inguinal hernia, without obstruction or gangrene, not specified as recurrent: Secondary | ICD-10-CM | POA: Diagnosis not present

## 2021-06-17 DIAGNOSIS — H209 Unspecified iridocyclitis: Secondary | ICD-10-CM | POA: Diagnosis not present

## 2021-06-17 DIAGNOSIS — Z947 Corneal transplant status: Secondary | ICD-10-CM | POA: Diagnosis not present

## 2021-06-17 DIAGNOSIS — H4043X3 Glaucoma secondary to eye inflammation, bilateral, severe stage: Secondary | ICD-10-CM | POA: Diagnosis not present

## 2021-06-17 DIAGNOSIS — H18231 Secondary corneal edema, right eye: Secondary | ICD-10-CM | POA: Diagnosis not present

## 2021-06-18 DIAGNOSIS — K4020 Bilateral inguinal hernia, without obstruction or gangrene, not specified as recurrent: Secondary | ICD-10-CM | POA: Diagnosis not present

## 2021-07-21 DIAGNOSIS — I129 Hypertensive chronic kidney disease with stage 1 through stage 4 chronic kidney disease, or unspecified chronic kidney disease: Secondary | ICD-10-CM | POA: Diagnosis not present

## 2021-07-21 DIAGNOSIS — H2701 Aphakia, right eye: Secondary | ICD-10-CM | POA: Diagnosis not present

## 2021-07-21 DIAGNOSIS — N189 Chronic kidney disease, unspecified: Secondary | ICD-10-CM | POA: Diagnosis not present

## 2021-07-21 DIAGNOSIS — H1820 Unspecified corneal edema: Secondary | ICD-10-CM | POA: Diagnosis not present

## 2021-07-21 DIAGNOSIS — H44113 Panuveitis, bilateral: Secondary | ICD-10-CM | POA: Diagnosis not present

## 2021-07-21 DIAGNOSIS — Z888 Allergy status to other drugs, medicaments and biological substances status: Secondary | ICD-10-CM | POA: Diagnosis not present

## 2021-07-21 DIAGNOSIS — Z79899 Other long term (current) drug therapy: Secondary | ICD-10-CM | POA: Diagnosis not present

## 2021-07-21 DIAGNOSIS — K219 Gastro-esophageal reflux disease without esophagitis: Secondary | ICD-10-CM | POA: Diagnosis not present

## 2021-09-28 DIAGNOSIS — D649 Anemia, unspecified: Secondary | ICD-10-CM | POA: Diagnosis not present

## 2021-09-28 DIAGNOSIS — I10 Essential (primary) hypertension: Secondary | ICD-10-CM | POA: Diagnosis not present

## 2021-09-28 DIAGNOSIS — E785 Hyperlipidemia, unspecified: Secondary | ICD-10-CM | POA: Diagnosis not present

## 2021-09-29 DIAGNOSIS — K4090 Unilateral inguinal hernia, without obstruction or gangrene, not specified as recurrent: Secondary | ICD-10-CM | POA: Diagnosis not present

## 2021-09-29 DIAGNOSIS — E785 Hyperlipidemia, unspecified: Secondary | ICD-10-CM | POA: Diagnosis not present

## 2021-09-29 DIAGNOSIS — N189 Chronic kidney disease, unspecified: Secondary | ICD-10-CM | POA: Diagnosis not present

## 2021-09-29 DIAGNOSIS — J301 Allergic rhinitis due to pollen: Secondary | ICD-10-CM | POA: Diagnosis not present

## 2021-09-29 DIAGNOSIS — Z0001 Encounter for general adult medical examination with abnormal findings: Secondary | ICD-10-CM | POA: Diagnosis not present

## 2021-09-29 DIAGNOSIS — D649 Anemia, unspecified: Secondary | ICD-10-CM | POA: Diagnosis not present

## 2021-10-15 DIAGNOSIS — I10 Essential (primary) hypertension: Secondary | ICD-10-CM | POA: Diagnosis not present

## 2021-10-15 DIAGNOSIS — I129 Hypertensive chronic kidney disease with stage 1 through stage 4 chronic kidney disease, or unspecified chronic kidney disease: Secondary | ICD-10-CM | POA: Diagnosis not present

## 2021-10-15 DIAGNOSIS — E2609 Other primary hyperaldosteronism: Secondary | ICD-10-CM | POA: Diagnosis not present

## 2021-10-15 DIAGNOSIS — N1832 Chronic kidney disease, stage 3b: Secondary | ICD-10-CM | POA: Diagnosis not present

## 2021-11-09 DIAGNOSIS — H179 Unspecified corneal scar and opacity: Secondary | ICD-10-CM | POA: Diagnosis not present

## 2021-11-09 DIAGNOSIS — Z947 Corneal transplant status: Secondary | ICD-10-CM | POA: Diagnosis not present

## 2021-12-15 DIAGNOSIS — K2100 Gastro-esophageal reflux disease with esophagitis, without bleeding: Secondary | ICD-10-CM | POA: Diagnosis not present

## 2021-12-15 DIAGNOSIS — I10 Essential (primary) hypertension: Secondary | ICD-10-CM | POA: Diagnosis not present

## 2021-12-15 DIAGNOSIS — J301 Allergic rhinitis due to pollen: Secondary | ICD-10-CM | POA: Diagnosis not present

## 2021-12-15 DIAGNOSIS — K2970 Gastritis, unspecified, without bleeding: Secondary | ICD-10-CM | POA: Diagnosis not present

## 2021-12-15 DIAGNOSIS — L732 Hidradenitis suppurativa: Secondary | ICD-10-CM | POA: Diagnosis not present

## 2021-12-15 DIAGNOSIS — N189 Chronic kidney disease, unspecified: Secondary | ICD-10-CM | POA: Diagnosis not present

## 2021-12-15 DIAGNOSIS — E785 Hyperlipidemia, unspecified: Secondary | ICD-10-CM | POA: Diagnosis not present

## 2021-12-15 DIAGNOSIS — K4090 Unilateral inguinal hernia, without obstruction or gangrene, not specified as recurrent: Secondary | ICD-10-CM | POA: Diagnosis not present

## 2022-01-12 DIAGNOSIS — E785 Hyperlipidemia, unspecified: Secondary | ICD-10-CM | POA: Diagnosis not present

## 2022-01-12 DIAGNOSIS — N189 Chronic kidney disease, unspecified: Secondary | ICD-10-CM | POA: Diagnosis not present

## 2022-01-13 DIAGNOSIS — J301 Allergic rhinitis due to pollen: Secondary | ICD-10-CM | POA: Diagnosis not present

## 2022-01-13 DIAGNOSIS — L732 Hidradenitis suppurativa: Secondary | ICD-10-CM | POA: Diagnosis not present

## 2022-01-13 DIAGNOSIS — K2100 Gastro-esophageal reflux disease with esophagitis, without bleeding: Secondary | ICD-10-CM | POA: Diagnosis not present

## 2022-01-13 DIAGNOSIS — N189 Chronic kidney disease, unspecified: Secondary | ICD-10-CM | POA: Diagnosis not present

## 2022-01-13 DIAGNOSIS — E785 Hyperlipidemia, unspecified: Secondary | ICD-10-CM | POA: Diagnosis not present

## 2022-01-13 DIAGNOSIS — K4090 Unilateral inguinal hernia, without obstruction or gangrene, not specified as recurrent: Secondary | ICD-10-CM | POA: Diagnosis not present

## 2022-01-13 DIAGNOSIS — I10 Essential (primary) hypertension: Secondary | ICD-10-CM | POA: Diagnosis not present

## 2022-01-13 DIAGNOSIS — K2970 Gastritis, unspecified, without bleeding: Secondary | ICD-10-CM | POA: Diagnosis not present

## 2022-02-16 DIAGNOSIS — H2702 Aphakia, left eye: Secondary | ICD-10-CM | POA: Diagnosis not present

## 2022-02-18 DIAGNOSIS — H2589 Other age-related cataract: Secondary | ICD-10-CM | POA: Diagnosis not present

## 2022-02-18 DIAGNOSIS — H27132 Posterior dislocation of lens, left eye: Secondary | ICD-10-CM | POA: Diagnosis not present

## 2022-02-18 DIAGNOSIS — Z888 Allergy status to other drugs, medicaments and biological substances status: Secondary | ICD-10-CM | POA: Diagnosis not present

## 2022-02-18 DIAGNOSIS — I129 Hypertensive chronic kidney disease with stage 1 through stage 4 chronic kidney disease, or unspecified chronic kidney disease: Secondary | ICD-10-CM | POA: Diagnosis not present

## 2022-02-18 DIAGNOSIS — H2522 Age-related cataract, morgagnian type, left eye: Secondary | ICD-10-CM | POA: Diagnosis not present

## 2022-02-18 DIAGNOSIS — H1820 Unspecified corneal edema: Secondary | ICD-10-CM | POA: Diagnosis not present

## 2022-02-18 DIAGNOSIS — K219 Gastro-esophageal reflux disease without esophagitis: Secondary | ICD-10-CM | POA: Diagnosis not present

## 2022-02-18 DIAGNOSIS — H179 Unspecified corneal scar and opacity: Secondary | ICD-10-CM | POA: Diagnosis not present

## 2022-02-18 DIAGNOSIS — H1789 Other corneal scars and opacities: Secondary | ICD-10-CM | POA: Diagnosis not present

## 2022-02-18 DIAGNOSIS — N1832 Chronic kidney disease, stage 3b: Secondary | ICD-10-CM | POA: Diagnosis not present

## 2022-02-18 DIAGNOSIS — H21542 Posterior synechiae (iris), left eye: Secondary | ICD-10-CM | POA: Diagnosis not present

## 2022-03-30 DIAGNOSIS — K219 Gastro-esophageal reflux disease without esophagitis: Secondary | ICD-10-CM | POA: Diagnosis not present

## 2022-03-30 DIAGNOSIS — Z1211 Encounter for screening for malignant neoplasm of colon: Secondary | ICD-10-CM | POA: Diagnosis not present

## 2022-04-14 ENCOUNTER — Ambulatory Visit: Payer: 59 | Admitting: Internal Medicine

## 2022-04-21 DIAGNOSIS — I129 Hypertensive chronic kidney disease with stage 1 through stage 4 chronic kidney disease, or unspecified chronic kidney disease: Secondary | ICD-10-CM | POA: Diagnosis not present

## 2022-04-21 DIAGNOSIS — E2609 Other primary hyperaldosteronism: Secondary | ICD-10-CM | POA: Diagnosis not present

## 2022-04-21 DIAGNOSIS — N1832 Chronic kidney disease, stage 3b: Secondary | ICD-10-CM | POA: Diagnosis not present

## 2022-04-21 DIAGNOSIS — I10 Essential (primary) hypertension: Secondary | ICD-10-CM | POA: Diagnosis not present

## 2022-05-07 ENCOUNTER — Ambulatory Visit: Payer: 59 | Admitting: Internal Medicine

## 2022-05-07 DIAGNOSIS — Z947 Corneal transplant status: Secondary | ICD-10-CM | POA: Diagnosis not present

## 2022-05-07 DIAGNOSIS — H44113 Panuveitis, bilateral: Secondary | ICD-10-CM | POA: Diagnosis not present

## 2022-05-07 DIAGNOSIS — H3582 Retinal ischemia: Secondary | ICD-10-CM | POA: Diagnosis not present

## 2022-05-07 DIAGNOSIS — H4043X3 Glaucoma secondary to eye inflammation, bilateral, severe stage: Secondary | ICD-10-CM | POA: Diagnosis not present

## 2022-05-07 DIAGNOSIS — H209 Unspecified iridocyclitis: Secondary | ICD-10-CM | POA: Diagnosis not present

## 2022-05-11 ENCOUNTER — Encounter: Payer: Self-pay | Admitting: Internal Medicine

## 2022-05-11 ENCOUNTER — Ambulatory Visit (INDEPENDENT_AMBULATORY_CARE_PROVIDER_SITE_OTHER): Payer: 59 | Admitting: Internal Medicine

## 2022-05-11 VITALS — BP 148/92 | HR 88 | Ht 67.0 in | Wt 149.4 lb

## 2022-05-11 DIAGNOSIS — I10 Essential (primary) hypertension: Secondary | ICD-10-CM | POA: Diagnosis not present

## 2022-05-11 DIAGNOSIS — E269 Hyperaldosteronism, unspecified: Secondary | ICD-10-CM

## 2022-05-11 DIAGNOSIS — E782 Mixed hyperlipidemia: Secondary | ICD-10-CM | POA: Diagnosis not present

## 2022-05-11 MED ORDER — SPIRONOLACTONE 100 MG PO TABS
100.0000 mg | ORAL_TABLET | Freq: Two times a day (BID) | ORAL | 75 refills | Status: DC
Start: 2022-05-11 — End: 2023-02-09

## 2022-05-11 MED ORDER — MINOXIDIL 2.5 MG PO TABS
7.5000 mg | ORAL_TABLET | Freq: Two times a day (BID) | ORAL | 2 refills | Status: DC
Start: 2022-05-11 — End: 2022-09-03

## 2022-05-11 MED ORDER — CLONIDINE 0.2 MG/24HR TD PTWK
0.4000 mg | MEDICATED_PATCH | TRANSDERMAL | 2 refills | Status: AC
Start: 2022-05-11 — End: 2022-08-09

## 2022-05-11 NOTE — Progress Notes (Signed)
Established Patient Office Visit  Subjective:  Patient ID: Christopher Mcdaniel., male    DOB: 1970-03-06  Age: 52 y.o. MRN: [id number]  Chief Complaint  Patient presents with   Follow-up    3 month follow up, discuss lab results.    No new complaints, BP elevated today but admits to delay in changing his last clonidine patch.    No other concerns at this time.   Past Medical History:  Diagnosis Date   Aphakia, right    Arthritis    knees   Cataract cortical, senile, left    Chronic kidney disease    CME (cystoid macular edema)    Complication of anesthesia    wakes up during surgery   Failed corneal transplant    GERD (gastroesophageal reflux disease)    History of hiatal hernia    Hyperaldosteronism ([facility])    Hypertension    Hypotony of eye    Macular degeneration    Mature cataract    Panuveitis    Panuveitis    POAG (primary open-angle glaucoma)    Uveitic glaucoma     Past Surgical History:  Procedure Laterality Date   ADRENAL GLAND SURGERY Bilateral 2016   biopsy   CORNEAL TRANSPLANT     EYE SURGERY  2017   fracture repair of left knee     FRACTURE SURGERY     FRACTURED LEFT KNEE   glaucoma eye surgery     lens eye surgery      Social History   Socioeconomic History   Marital status: Married    Spouse name: Not on file   Number of children: Not on file   Years of education: Not on file   Highest education level: Not on file  Occupational History   Not on file  Tobacco Use   Smoking status: Never   Smokeless tobacco: Never  Vaping Use   Vaping Use: Never used  Substance and Sexual Activity   Alcohol use: No   Drug use: No   Sexual activity: Not on file  Other Topics Concern   Not on file  Social History Narrative   Not on file   Social Determinants of Health   Financial Resource Strain: Not on file  Food Insecurity: Not on file  Transportation Needs: Not on file  Physical Activity: Not on file  Stress: Not on file  Social  Connections: Not on file  Intimate Partner Violence: Not on file    Family History  Problem Relation Age of Onset   Allergies Mother    Hypertension Mother    Healthy Father    Cancer Neg Hx     Allergies  Allergen Reactions   Ace Inhibitors Swelling    Lip swelling    Quinapril Swelling    angioedema    Review of Systems  Constitutional: Negative.   HENT: Negative.    Eyes: Negative.   Respiratory: Negative.    Cardiovascular: Negative.   Gastrointestinal: Negative.   Genitourinary: Negative.   Skin: Negative.   Neurological: Negative.   Endo/Heme/Allergies: Negative.        Objective:   BP (!) 148/92   Pulse 88   Ht 5\' 7"  (1.702 m)   Wt 149 lb 6.4 oz (67.8 kg)   SpO2 99%   BMI 23.40 kg/m   Vitals:   05/11/22 1525  BP: (!) 148/92  Pulse: 88  Height: 5\' 7"  (1.702 m)  Weight: 149 lb 6.4 oz (67.8 kg)  SpO2: 99%  BMI (Calculated): 23.39    Physical Exam Vitals reviewed.  Constitutional:      Appearance: Normal appearance.  HENT:     Head: Normocephalic.     Left Ear: There is no impacted cerumen.     Nose: Nose normal.     Mouth/Throat:     Mouth: Mucous membranes are moist.     Pharynx: No posterior oropharyngeal erythema.  Eyes:     Extraocular Movements: Extraocular movements intact.     Pupils: Pupils are equal, round, and reactive to light.     Comments: Visually impaired  Cardiovascular:     Rate and Rhythm: Regular rhythm.     Chest Wall: PMI is not displaced.     Pulses: Normal pulses.     Heart sounds: Normal heart sounds. No murmur heard. Pulmonary:     Effort: Pulmonary effort is normal.     Breath sounds: Normal air entry. No rhonchi or rales.  Abdominal:     General: Abdomen is flat. Bowel sounds are normal. There is no distension.     Palpations: Abdomen is soft. There is no hepatomegaly, splenomegaly or mass.     Tenderness: There is no abdominal tenderness.  Musculoskeletal:        General: Normal range of motion.      Cervical back: Normal range of motion and neck supple.     Right lower leg: No edema.     Left lower leg: No edema.  Skin:    General: Skin is warm and dry.  Neurological:     General: No focal deficit present.     Mental Status: He is alert and oriented to person, place, and time.     Cranial Nerves: No cranial nerve deficit.     Motor: No weakness.  Psychiatric:        Mood and Affect: Mood normal.        Behavior: Behavior normal.      No results found for any visits on 05/11/22.  No results found for this or any previous visit (from the past 2160 hour(Zianne Schubring)).    Assessment & Plan:   Problem List Items Addressed This Visit       Cardiovascular and Mediastinum   Essential hypertension - Primary   Relevant Medications   atorvastatin (LIPITOR) 20 MG tablet   spironolactone (ALDACTONE) 100 MG tablet   minoxidil (LONITEN) 2.5 MG tablet   cloNIDine (CATAPRES - DOSED IN MG/24 HR) 0.2 mg/24hr patch     Endocrine   Hyperaldosteronism ([facility])   Relevant Medications   spironolactone (ALDACTONE) 100 MG tablet   Other Visit Diagnoses     Mixed hyperlipidemia       Relevant Medications   atorvastatin (LIPITOR) 20 MG tablet   spironolactone (ALDACTONE) 100 MG tablet   minoxidil (LONITEN) 2.5 MG tablet   cloNIDine (CATAPRES - DOSED IN MG/24 HR) 0.2 mg/24hr patch   Other Relevant Orders   Lipid panel   Hepatic function panel       Return in about 3 months (around 08/10/2022) for fu with labs prior.   Total time spent: 20 minutes  Luna Fuse, MD  05/11/2022

## 2022-07-05 DIAGNOSIS — H209 Unspecified iridocyclitis: Secondary | ICD-10-CM | POA: Diagnosis not present

## 2022-07-05 DIAGNOSIS — H44113 Panuveitis, bilateral: Secondary | ICD-10-CM | POA: Diagnosis not present

## 2022-07-05 DIAGNOSIS — H4043X3 Glaucoma secondary to eye inflammation, bilateral, severe stage: Secondary | ICD-10-CM | POA: Diagnosis not present

## 2022-07-05 DIAGNOSIS — Z947 Corneal transplant status: Secondary | ICD-10-CM | POA: Diagnosis not present

## 2022-07-05 DIAGNOSIS — H3582 Retinal ischemia: Secondary | ICD-10-CM | POA: Diagnosis not present

## 2022-07-19 ENCOUNTER — Other Ambulatory Visit: Payer: Self-pay | Admitting: Internal Medicine

## 2022-07-19 DIAGNOSIS — I10 Essential (primary) hypertension: Secondary | ICD-10-CM

## 2022-07-28 ENCOUNTER — Ambulatory Visit: Admission: RE | Admit: 2022-07-28 | Payer: 59 | Source: Home / Self Care | Admitting: Internal Medicine

## 2022-07-28 ENCOUNTER — Encounter: Admission: RE | Payer: Self-pay | Source: Home / Self Care

## 2022-07-28 SURGERY — COLONOSCOPY WITH PROPOFOL
Anesthesia: General

## 2022-07-30 ENCOUNTER — Other Ambulatory Visit: Payer: Self-pay | Admitting: Internal Medicine

## 2022-07-30 DIAGNOSIS — I10 Essential (primary) hypertension: Secondary | ICD-10-CM

## 2022-07-31 ENCOUNTER — Other Ambulatory Visit: Payer: Self-pay | Admitting: Internal Medicine

## 2022-07-31 DIAGNOSIS — E785 Hyperlipidemia, unspecified: Secondary | ICD-10-CM

## 2022-08-04 ENCOUNTER — Ambulatory Visit: Payer: 59 | Admitting: Internal Medicine

## 2022-08-10 ENCOUNTER — Ambulatory Visit: Payer: 59 | Admitting: Internal Medicine

## 2022-09-03 ENCOUNTER — Ambulatory Visit (INDEPENDENT_AMBULATORY_CARE_PROVIDER_SITE_OTHER): Payer: 59 | Admitting: Internal Medicine

## 2022-09-03 ENCOUNTER — Encounter: Payer: Self-pay | Admitting: Internal Medicine

## 2022-09-03 VITALS — BP 150/90 | HR 83 | Ht 67.0 in | Wt 148.6 lb

## 2022-09-03 DIAGNOSIS — E782 Mixed hyperlipidemia: Secondary | ICD-10-CM

## 2022-09-03 DIAGNOSIS — I10 Essential (primary) hypertension: Secondary | ICD-10-CM

## 2022-09-03 DIAGNOSIS — E269 Hyperaldosteronism, unspecified: Secondary | ICD-10-CM

## 2022-09-03 MED ORDER — MINOXIDIL 2.5 MG PO TABS
7.5000 mg | ORAL_TABLET | Freq: Two times a day (BID) | ORAL | 2 refills | Status: DC
Start: 2022-09-03 — End: 2023-01-19

## 2022-09-03 MED ORDER — CLONIDINE 0.3 MG/24HR TD PTWK
0.3000 mg | MEDICATED_PATCH | TRANSDERMAL | 2 refills | Status: DC
Start: 2022-09-03 — End: 2023-01-19

## 2022-09-03 NOTE — Progress Notes (Signed)
Established Patient Office Visit  Subjective:  Patient ID: Christopher Polson., male    DOB: [birth year]  Age: 52 y.o. MRN: [id number]  Chief Complaint  Patient presents with   Follow-up    3 mo f/u with lab results    No new complaints, here for lab review and medication refills. Failed to have previsit labs done. Underwent successful surgery on his left eye and vision restored.  No other concerns at this time.   Past Medical History:  Diagnosis Date   Aphakia, right    Arthritis    knees   Cataract cortical, senile, left    Chronic kidney disease    CME (cystoid macular edema)    Complication of anesthesia    wakes up during surgery   Failed corneal transplant    GERD (gastroesophageal reflux disease)    History of hiatal hernia    Hyperaldosteronism (HCC)    Hypertension    Hypotony of eye    Macular degeneration    Mature cataract    Panuveitis    Panuveitis    POAG (primary open-angle glaucoma)    Uveitic glaucoma     Past Surgical History:  Procedure Laterality Date   ADRENAL GLAND SURGERY Bilateral 2016   biopsy   CORNEAL TRANSPLANT     EYE SURGERY  2017   fracture repair of left knee     FRACTURE SURGERY     FRACTURED LEFT KNEE   glaucoma eye surgery     lens eye surgery      Social History   Socioeconomic History   Marital status: Married    Spouse name: Not on file   Number of children: Not on file   Years of education: Not on file   Highest education level: Not on file  Occupational History   Not on file  Tobacco Use   Smoking status: Never   Smokeless tobacco: Never  Vaping Use   Vaping status: Never Used  Substance and Sexual Activity   Alcohol use: No   Drug use: No   Sexual activity: Not on file  Other Topics Concern   Not on file  Social History Narrative   Not on file   Social Determinants of Health   Financial Resource Strain: Not on file  Food Insecurity: Not on file  Transportation Needs: Not on file  Physical  Activity: Not on file  Stress: Not on file  Social Connections: Not on file  Intimate Partner Violence: Not on file    Family History  Problem Relation Age of Onset   Allergies Mother    Hypertension Mother    Healthy Father    Cancer Neg Hx     Allergies  Allergen Reactions   Ace Inhibitors Swelling    Lip swelling    Quinapril Swelling    angioedema    Review of Systems  Constitutional: Negative.   HENT: Negative.    Eyes: Negative.   Respiratory: Negative.    Cardiovascular: Negative.   Gastrointestinal: Negative.   Genitourinary: Negative.   Skin: Negative.   Neurological: Negative.   Endo/Heme/Allergies: Negative.        Objective:   BP (!) 150/90   Pulse 83   Ht 5\' 7"  (1.702 m)   Wt 148 lb 9.6 oz (67.4 kg)   SpO2 98%   BMI 23.27 kg/m   Vitals:   09/03/22 1536  BP: (!) 150/90  Pulse: 83  Height: 5\' 7"  (1.702 m)  Weight:  148 lb 9.6 oz (67.4 kg)  SpO2: 98%  BMI (Calculated): 23.27    Physical Exam Vitals reviewed.  Constitutional:      Appearance: Normal appearance.  HENT:     Head: Normocephalic.     Left Ear: There is no impacted cerumen.     Nose: Nose normal.     Mouth/Throat:     Mouth: Mucous membranes are moist.     Pharynx: No posterior oropharyngeal erythema.  Eyes:     Extraocular Movements: Extraocular movements intact.     Pupils: Pupils are equal, round, and reactive to light.     Comments: Visually impaired  Cardiovascular:     Rate and Rhythm: Regular rhythm.     Chest Wall: PMI is not displaced.     Pulses: Normal pulses.     Heart sounds: Normal heart sounds. No murmur heard. Pulmonary:     Effort: Pulmonary effort is normal.     Breath sounds: Normal air entry. No rhonchi or rales.  Abdominal:     General: Abdomen is flat. Bowel sounds are normal. There is no distension.     Palpations: Abdomen is soft. There is no hepatomegaly, splenomegaly or mass.     Tenderness: There is no abdominal tenderness.   Musculoskeletal:        General: Normal range of motion.     Cervical back: Normal range of motion and neck supple.     Right lower leg: No edema.     Left lower leg: No edema.  Skin:    General: Skin is warm and dry.  Neurological:     General: No focal deficit present.     Mental Status: He is alert and oriented to person, place, and time.     Cranial Nerves: No cranial nerve deficit.     Motor: No weakness.  Psychiatric:        Mood and Affect: Mood normal.        Behavior: Behavior normal.      No results found for any visits on 09/03/22.  No results found for this or any previous visit (from the past 2160 hour(Detric Scalisi)).    Assessment & Plan:  As per problem list  Problem List Items Addressed This Visit       Cardiovascular and Mediastinum   Essential hypertension   Relevant Medications   minoxidil (LONITEN) 2.5 MG tablet   cloNIDine (CATAPRES - DOSED IN MG/24 HR) 0.3 mg/24hr patch     Endocrine   Hyperaldosteronism (HCC) - Primary   Other Visit Diagnoses     Essential (primary) hypertension       Relevant Medications   minoxidil (LONITEN) 2.5 MG tablet   cloNIDine (CATAPRES - DOSED IN MG/24 HR) 0.3 mg/24hr patch   Other Relevant Orders   CBC With Diff/Platelet   Mixed hyperlipidemia       Relevant Medications   minoxidil (LONITEN) 2.5 MG tablet   cloNIDine (CATAPRES - DOSED IN MG/24 HR) 0.3 mg/24hr patch   Other Relevant Orders   Comprehensive metabolic panel   Lipid panel       Return in about 3 months (around 12/04/2022) for cpe with labs prior.   Total time spent: 20 minutes  Luna Fuse, MD  09/03/2022   This document may have been prepared by [facility] Voice Recognition software and as such may include unintentional dictation errors.

## 2022-09-06 DIAGNOSIS — Z947 Corneal transplant status: Secondary | ICD-10-CM | POA: Diagnosis not present

## 2022-10-13 ENCOUNTER — Other Ambulatory Visit: Payer: Self-pay | Admitting: Internal Medicine

## 2022-10-13 DIAGNOSIS — I10 Essential (primary) hypertension: Secondary | ICD-10-CM

## 2022-10-21 DIAGNOSIS — H4043X3 Glaucoma secondary to eye inflammation, bilateral, severe stage: Secondary | ICD-10-CM | POA: Diagnosis not present

## 2022-10-21 DIAGNOSIS — H44113 Panuveitis, bilateral: Secondary | ICD-10-CM | POA: Diagnosis not present

## 2022-10-21 DIAGNOSIS — H3582 Retinal ischemia: Secondary | ICD-10-CM | POA: Diagnosis not present

## 2022-10-21 DIAGNOSIS — H209 Unspecified iridocyclitis: Secondary | ICD-10-CM | POA: Diagnosis not present

## 2022-11-03 ENCOUNTER — Encounter: Admission: RE | Payer: Self-pay | Source: Home / Self Care

## 2022-11-03 ENCOUNTER — Ambulatory Visit: Admission: RE | Admit: 2022-11-03 | Payer: 59 | Source: Home / Self Care | Admitting: Internal Medicine

## 2022-11-03 SURGERY — COLONOSCOPY WITH PROPOFOL
Anesthesia: General

## 2022-11-16 DIAGNOSIS — E790 Hyperuricemia without signs of inflammatory arthritis and tophaceous disease: Secondary | ICD-10-CM | POA: Diagnosis not present

## 2022-11-16 DIAGNOSIS — D631 Anemia in chronic kidney disease: Secondary | ICD-10-CM | POA: Diagnosis not present

## 2022-11-16 DIAGNOSIS — I10 Essential (primary) hypertension: Secondary | ICD-10-CM | POA: Diagnosis not present

## 2022-11-16 DIAGNOSIS — I129 Hypertensive chronic kidney disease with stage 1 through stage 4 chronic kidney disease, or unspecified chronic kidney disease: Secondary | ICD-10-CM | POA: Diagnosis not present

## 2022-11-16 DIAGNOSIS — E2609 Other primary hyperaldosteronism: Secondary | ICD-10-CM | POA: Diagnosis not present

## 2022-11-16 DIAGNOSIS — N1832 Chronic kidney disease, stage 3b: Secondary | ICD-10-CM | POA: Diagnosis not present

## 2022-11-30 ENCOUNTER — Ambulatory Visit: Payer: 59 | Admitting: Internal Medicine

## 2022-12-27 ENCOUNTER — Ambulatory Visit: Payer: 59 | Admitting: Internal Medicine

## 2023-01-18 ENCOUNTER — Other Ambulatory Visit: Payer: 59

## 2023-01-18 DIAGNOSIS — I10 Essential (primary) hypertension: Secondary | ICD-10-CM | POA: Diagnosis not present

## 2023-01-18 DIAGNOSIS — E782 Mixed hyperlipidemia: Secondary | ICD-10-CM | POA: Diagnosis not present

## 2023-01-19 ENCOUNTER — Ambulatory Visit: Payer: 59 | Admitting: Internal Medicine

## 2023-01-19 ENCOUNTER — Encounter: Payer: Self-pay | Admitting: Internal Medicine

## 2023-01-19 VITALS — BP 154/100 | HR 87 | Temp 97.5°F | Ht 67.0 in | Wt 142.2 lb

## 2023-01-19 DIAGNOSIS — I714 Abdominal aortic aneurysm, without rupture, unspecified: Secondary | ICD-10-CM | POA: Diagnosis not present

## 2023-01-19 DIAGNOSIS — Z1331 Encounter for screening for depression: Secondary | ICD-10-CM | POA: Diagnosis not present

## 2023-01-19 DIAGNOSIS — I10 Essential (primary) hypertension: Secondary | ICD-10-CM | POA: Diagnosis not present

## 2023-01-19 DIAGNOSIS — E269 Hyperaldosteronism, unspecified: Secondary | ICD-10-CM

## 2023-01-19 DIAGNOSIS — Z0001 Encounter for general adult medical examination with abnormal findings: Secondary | ICD-10-CM | POA: Diagnosis not present

## 2023-01-19 DIAGNOSIS — E782 Mixed hyperlipidemia: Secondary | ICD-10-CM | POA: Diagnosis not present

## 2023-01-19 LAB — CBC WITH DIFF/PLATELET
Basophils Absolute: 0.1 10*3/uL (ref 0.0–0.2)
Basos: 1 %
EOS (ABSOLUTE): 0.3 10*3/uL (ref 0.0–0.4)
Eos: 2 %
Hematocrit: 36.7 % — ABNORMAL LOW (ref 37.5–51.0)
Hemoglobin: 12 g/dL — ABNORMAL LOW (ref 13.0–17.7)
Immature Grans (Abs): 0 10*3/uL (ref 0.0–0.1)
Immature Granulocytes: 0 %
Lymphocytes Absolute: 2.1 10*3/uL (ref 0.7–3.1)
Lymphs: 21 %
MCH: 30.8 pg (ref 26.6–33.0)
MCHC: 32.7 g/dL (ref 31.5–35.7)
MCV: 94 fL (ref 79–97)
Monocytes Absolute: 0.7 10*3/uL (ref 0.1–0.9)
Monocytes: 7 %
Neutrophils Absolute: 7.2 10*3/uL — ABNORMAL HIGH (ref 1.4–7.0)
Neutrophils: 69 %
Platelets: 258 10*3/uL (ref 150–450)
RBC: 3.9 x10E6/uL — ABNORMAL LOW (ref 4.14–5.80)
RDW: 14.8 % (ref 11.6–15.4)
WBC: 10.3 10*3/uL (ref 3.4–10.8)

## 2023-01-19 LAB — COMPREHENSIVE METABOLIC PANEL
ALT: 5 [IU]/L (ref 0–44)
AST: 12 [IU]/L (ref 0–40)
Albumin: 3.9 g/dL (ref 3.8–4.9)
Alkaline Phosphatase: 57 [IU]/L (ref 44–121)
BUN/Creatinine Ratio: 12 (ref 9–20)
BUN: 43 mg/dL — ABNORMAL HIGH (ref 6–24)
Bilirubin Total: 0.2 mg/dL (ref 0.0–1.2)
CO2: 25 mmol/L (ref 20–29)
Calcium: 9.2 mg/dL (ref 8.7–10.2)
Chloride: 104 mmol/L (ref 96–106)
Creatinine, Ser: 3.67 mg/dL — ABNORMAL HIGH (ref 0.76–1.27)
Globulin, Total: 2.6 g/dL (ref 1.5–4.5)
Glucose: 106 mg/dL — ABNORMAL HIGH (ref 70–99)
Potassium: 5.2 mmol/L (ref 3.5–5.2)
Sodium: 141 mmol/L (ref 134–144)
Total Protein: 6.5 g/dL (ref 6.0–8.5)
eGFR: 19 mL/min/{1.73_m2} — ABNORMAL LOW (ref >59)

## 2023-01-19 LAB — LIPID PANEL
Chol/HDL Ratio: 3.8 {ratio} (ref 0.0–5.0)
Cholesterol, Total: 156 mg/dL (ref 100–199)
HDL: 41 mg/dL (ref >39)
LDL Chol Calc (NIH): 88 mg/dL (ref 0–99)
Triglycerides: 154 mg/dL — ABNORMAL HIGH (ref 0–149)
VLDL Cholesterol Cal: 27 mg/dL (ref 5–40)

## 2023-01-19 LAB — HEPATIC FUNCTION PANEL: Bilirubin, Direct: 0.1 mg/dL (ref 0.00–0.40)

## 2023-01-19 MED ORDER — ATORVASTATIN CALCIUM 20 MG PO TABS: 20.0000 mg | ORAL_TABLET | Freq: Every day | ORAL | 1 refills | Status: DC

## 2023-01-19 MED ORDER — CLONIDINE 0.3 MG/24HR TD PTWK: 0.3000 mg | MEDICATED_PATCH | TRANSDERMAL | 2 refills | Status: DC

## 2023-01-19 MED ORDER — MINOXIDIL 2.5 MG PO TABS: 7.5000 mg | ORAL_TABLET | Freq: Two times a day (BID) | ORAL | 2 refills | Status: DC

## 2023-01-19 MED ORDER — CARVEDILOL 25 MG PO TABS: 50.0000 mg | ORAL_TABLET | Freq: Two times a day (BID) | ORAL | 1 refills | Status: DC

## 2023-01-19 NOTE — Progress Notes (Signed)
 Established Patient Office Visit  Subjective:  Patient ID: Christopher Lampert., male    DOB: 02-Jun-1970  Age: 53 y.o. MRN: [id number]  Chief Complaint  Patient presents with   Annual Exam    AWV, discuss lab results.    No new complaints, here for AWV, lab review and medication refills. Labs reviewed and notable for elevated trigs, normal cholesterol and improvement in Hgb.  BP still elevated however recently started on Doxazosin by nephrology    No other concerns at this time.   Past Medical History:  Diagnosis Date   Aphakia, right    Arthritis    knees   Cataract cortical, senile, left    Chronic kidney disease    CME (cystoid macular edema)    Complication of anesthesia    wakes up during surgery   Failed corneal transplant    GERD (gastroesophageal reflux disease)    History of hiatal hernia    Hyperaldosteronism (HCC)    Hypertension    Hypotony of eye    Macular degeneration    Mature cataract    Panuveitis    Panuveitis    POAG (primary open-angle glaucoma)    Uveitic glaucoma     Past Surgical History:  Procedure Laterality Date   ADRENAL GLAND SURGERY Bilateral 2016   biopsy   CORNEAL TRANSPLANT     EYE SURGERY  2017   fracture repair of left knee     FRACTURE SURGERY     FRACTURED LEFT KNEE   glaucoma eye surgery     lens eye surgery      Social History   Socioeconomic History   Marital status: Married    Spouse name: Not on file   Number of children: Not on file   Years of education: Not on file   Highest education level: Not on file  Occupational History   Not on file  Tobacco Use   Smoking status: Never   Smokeless tobacco: Never  Vaping Use   Vaping status: Never Used  Substance and Sexual Activity   Alcohol use: No   Drug use: No   Sexual activity: Not on file  Other Topics Concern   Not on file  Social History Narrative   Not on file   Social Drivers of Health   Financial Resource Strain: Not on file  Food  Insecurity: Not on file  Transportation Needs: Not on file  Physical Activity: Not on file  Stress: Not on file  Social Connections: Not on file  Intimate Partner Violence: Not on file    Family History  Problem Relation Age of Onset   Allergies Mother    Hypertension Mother    Healthy Father    Cancer Neg Hx     Allergies  Allergen Reactions   Ace Inhibitors Swelling    Lip swelling    Quinapril Swelling    angioedema    Outpatient Medications Prior to Visit  Medication Sig   Ascorbic Acid (VITAMIN C GUMMIE PO) Take 2 tablets by mouth daily.   chlorthalidone  (HYGROTON ) 50 MG tablet TAKE 1 TABLET BY MOUTH EVERY DAY IN THE MORNING   dapagliflozin propanediol (FARXIGA) 5 MG TABS tablet Take 5 mg by mouth daily.   ELDERBERRY PO Take 2 tablets by mouth daily.   ondansetron  (ZOFRAN ) 4 MG tablet Take 4 mg by mouth every 8 (eight) hours as needed for nausea or vomiting.   pantoprazole (PROTONIX) 40 MG tablet Take 40 mg by mouth  2 (two) times daily.   prednisoLONE acetate (PRED FORTE) 1 % ophthalmic suspension Place 1 drop into both eyes every 4 (four) hours.   spironolactone  (ALDACTONE ) 100 MG tablet Take 1 tablet (100 mg total) by mouth 2 (two) times daily.   sucralfate (CARAFATE) 1 g tablet Take 1 g by mouth as needed.   [DISCONTINUED] atorvastatin  (LIPITOR) 20 MG tablet TAKE 1 TABLET BY MOUTH EVERY DAY   [DISCONTINUED] carvedilol  (COREG ) 25 MG tablet TAKE 2 TABLETS BY MOUTH TWICE A DAY   [DISCONTINUED] cloNIDine  (CATAPRES  - DOSED IN MG/24 HR) 0.3 mg/24hr patch Place 1 patch (0.3 mg total) onto the skin once a week.   [DISCONTINUED] minoxidil  (LONITEN ) 2.5 MG tablet Take 3 tablets (7.5 mg total) by mouth 2 (two) times daily.   cetirizine (ZYRTEC) 10 MG tablet Take 10 mg by mouth daily. (Patient not taking: Reported on 01/19/2023)   cholecalciferol (VITAMIN D3) 25 MCG (1000 UNIT) tablet Take 1,000 Units by mouth daily. (Patient not taking: Reported on 01/19/2023)   NIFEdipine (ADALAT  CC) 90 MG 24 hr tablet Take 90 mg by mouth daily.  (Patient not taking: Reported on 05/11/2022)   potassium chloride  SA (KLOR-CON  M20) 20 MEQ tablet Take 1 tablet (20 mEq total) by mouth daily. (Patient not taking: Reported on 05/11/2022)   predniSONE (DELTASONE) 10 MG tablet Take 10 mg by mouth daily with breakfast. (Patient not taking: Reported on 01/19/2023)   No facility-administered medications prior to visit.    Review of Systems  Constitutional: Negative.   HENT: Negative.    Eyes: Negative.   Respiratory: Negative.    Cardiovascular: Negative.   Gastrointestinal: Negative.   Genitourinary: Negative.   Skin: Negative.   Neurological: Negative.   Endo/Heme/Allergies: Negative.        Objective:   BP (!) 154/100   Pulse 87   Temp (!) 97.5 F (36.4 C) (Tympanic)   Ht 5' 7 (1.702 m)   Wt 142 lb 3.2 oz (64.5 kg)   SpO2 97%   BMI 22.27 kg/m   Vitals:   01/19/23 1033  BP: (!) 154/100  Pulse: 87  Temp: (!) 97.5 F (36.4 C)  Height: 5' 7 (1.702 m)  Weight: 142 lb 3.2 oz (64.5 kg)  SpO2: 97%  TempSrc: Tympanic  BMI (Calculated): 22.27    Physical Exam Vitals reviewed.  Constitutional:      Appearance: Normal appearance.  HENT:     Head: Normocephalic.     Left Ear: There is no impacted cerumen.     Nose: Nose normal.     Mouth/Throat:     Mouth: Mucous membranes are moist.     Pharynx: No posterior oropharyngeal erythema.  Eyes:     Extraocular Movements: Extraocular movements intact.     Pupils: Pupils are equal, round, and reactive to light.     Comments: Visually impaired  Cardiovascular:     Rate and Rhythm: Regular rhythm.     Chest Wall: PMI is not displaced.     Pulses: Normal pulses.     Heart sounds: Normal heart sounds. No murmur heard. Pulmonary:     Effort: Pulmonary effort is normal.     Breath sounds: Normal air entry. No rhonchi or rales.  Abdominal:     General: Abdomen is flat. Bowel sounds are normal. There is abdominal bruit. There  is no distension.     Palpations: Abdomen is soft. There is pulsatile mass (left of midline). There is no hepatomegaly, splenomegaly or mass.  Tenderness: There is no abdominal tenderness.  Musculoskeletal:        General: Normal range of motion.     Cervical back: Normal range of motion and neck supple.     Right lower leg: No edema.     Left lower leg: No edema.  Skin:    General: Skin is warm and dry.  Neurological:     General: No focal deficit present.     Mental Status: He is alert and oriented to person, place, and time.     Cranial Nerves: No cranial nerve deficit.     Motor: No weakness.  Psychiatric:        Mood and Affect: Mood normal.        Behavior: Behavior normal.      No results found for any visits on 01/19/23.  Recent Results (from the past 2160 hours)  Lipid panel     Status: Abnormal   Collection Time: 01/18/23 10:28 AM  Result Value Ref Range   Cholesterol, Total 156 100 - 199 mg/dL   Triglycerides 845 (H) 0 - 149 mg/dL   HDL 41 >60 mg/dL   VLDL Cholesterol Cal 27 5 - 40 mg/dL   LDL Chol Calc (NIH) 88 0 - 99 mg/dL   Chol/HDL Ratio 3.8 0.0 - 5.0 ratio    Comment:                                   T. Chol/HDL Ratio                                             Men  Women                               1/2 Avg.Risk  3.4    3.3                                   Avg.Risk  5.0    4.4                                2X Avg.Risk  9.6    7.1                                3X Avg.Risk 23.4   11.0   Comprehensive metabolic panel     Status: Abnormal   Collection Time: 01/18/23 10:28 AM  Result Value Ref Range   Glucose 106 (H) 70 - 99 mg/dL   BUN 43 (H) 6 - 24 mg/dL   Creatinine, Ser 6.32 (H) 0.76 - 1.27 mg/dL   eGFR 19 (L) >40 fO/fpw/8.26   BUN/Creatinine Ratio 12 9 - 20   Sodium 141 134 - 144 mmol/L   Potassium 5.2 3.5 - 5.2 mmol/L   Chloride 104 96 - 106 mmol/L   CO2 25 20 - 29 mmol/L   Calcium  9.2 8.7 - 10.2 mg/dL   Total Protein 6.5 6.0 - 8.5 g/dL    Albumin 3.9 3.8 - 4.9 g/dL   Globulin, Total 2.6 1.5 -  4.5 g/dL   Bilirubin Total 0.2 0.0 - 1.2 mg/dL   Alkaline Phosphatase 57 44 - 121 IU/L   AST 12 0 - 40 IU/L   ALT 5 0 - 44 IU/L  CBC With Diff/Platelet     Status: Abnormal   Collection Time: 01/18/23 10:28 AM  Result Value Ref Range   WBC 10.3 3.4 - 10.8 x10E3/uL   RBC 3.90 (L) 4.14 - 5.80 x10E6/uL   Hemoglobin 12.0 (L) 13.0 - 17.7 g/dL   Hematocrit 63.2 (L) 62.4 - 51.0 %   MCV 94 79 - 97 fL   MCH 30.8 26.6 - 33.0 pg   MCHC 32.7 31.5 - 35.7 g/dL   RDW 85.1 88.3 - 84.5 %   Platelets 258 150 - 450 x10E3/uL   Neutrophils 69 Not Estab. %   Lymphs 21 Not Estab. %   Monocytes 7 Not Estab. %   Eos 2 Not Estab. %   Basos 1 Not Estab. %   Neutrophils Absolute 7.2 (H) 1.4 - 7.0 x10E3/uL   Lymphocytes Absolute 2.1 0.7 - 3.1 x10E3/uL   Monocytes Absolute 0.7 0.1 - 0.9 x10E3/uL   EOS (ABSOLUTE) 0.3 0.0 - 0.4 x10E3/uL   Basophils Absolute 0.1 0.0 - 0.2 x10E3/uL   Immature Granulocytes 0 Not Estab. %   Immature Grans (Abs) 0.0 0.0 - 0.1 x10E3/uL  Hepatic function panel     Status: None   Collection Time: 01/18/23 10:28 AM  Result Value Ref Range   Bilirubin, Direct 0.10 0.00 - 0.40 mg/dL      Assessment & Plan:  As per problem list  Problem List Items Addressed This Visit       Cardiovascular and Mediastinum   Essential hypertension   Relevant Medications   cloNIDine  (CATAPRES  - DOSED IN MG/24 HR) 0.3 mg/24hr patch   minoxidil  (LONITEN ) 2.5 MG tablet   atorvastatin  (LIPITOR) 20 MG tablet   carvedilol  (COREG ) 25 MG tablet     Endocrine   Hyperaldosteronism (HCC)   Other Visit Diagnoses       Abdominal aortic aneurysm (AAA) without rupture, unspecified part (HCC)    -  Primary   Relevant Medications   cloNIDine  (CATAPRES  - DOSED IN MG/24 HR) 0.3 mg/24hr patch   minoxidil  (LONITEN ) 2.5 MG tablet   atorvastatin  (LIPITOR) 20 MG tablet   carvedilol  (COREG ) 25 MG tablet   Other Relevant Orders   US  AORTA     Mixed  hyperlipidemia       Relevant Medications   cloNIDine  (CATAPRES  - DOSED IN MG/24 HR) 0.3 mg/24hr patch   minoxidil  (LONITEN ) 2.5 MG tablet   atorvastatin  (LIPITOR) 20 MG tablet   carvedilol  (COREG ) 25 MG tablet     Essential (primary) hypertension       Relevant Medications   cloNIDine  (CATAPRES  - DOSED IN MG/24 HR) 0.3 mg/24hr patch   minoxidil  (LONITEN ) 2.5 MG tablet   atorvastatin  (LIPITOR) 20 MG tablet   carvedilol  (COREG ) 25 MG tablet     Hyperlipidemia, unspecified       Relevant Medications   cloNIDine  (CATAPRES  - DOSED IN MG/24 HR) 0.3 mg/24hr patch   minoxidil  (LONITEN ) 2.5 MG tablet   atorvastatin  (LIPITOR) 20 MG tablet   carvedilol  (COREG ) 25 MG tablet       Return in about 3 weeks (around 02/09/2023) for Imaging results.   Total time spent: 30 minutes  Sherrill Cinderella Perry, MD  01/19/2023   This document may have been prepared by [facility] Voice Recognition software  and as such may include unintentional dictation errors.

## 2023-01-26 ENCOUNTER — Other Ambulatory Visit: Payer: 59

## 2023-01-26 ENCOUNTER — Ambulatory Visit: Payer: 59

## 2023-01-26 DIAGNOSIS — I714 Abdominal aortic aneurysm, without rupture, unspecified: Secondary | ICD-10-CM | POA: Diagnosis not present

## 2023-01-31 ENCOUNTER — Encounter: Payer: Self-pay | Admitting: Internal Medicine

## 2023-01-31 DIAGNOSIS — I714 Abdominal aortic aneurysm, without rupture, unspecified: Secondary | ICD-10-CM

## 2023-02-07 DIAGNOSIS — H4043X3 Glaucoma secondary to eye inflammation, bilateral, severe stage: Secondary | ICD-10-CM | POA: Diagnosis not present

## 2023-02-07 DIAGNOSIS — Z79899 Other long term (current) drug therapy: Secondary | ICD-10-CM | POA: Diagnosis not present

## 2023-02-07 DIAGNOSIS — H209 Unspecified iridocyclitis: Secondary | ICD-10-CM | POA: Diagnosis not present

## 2023-02-07 DIAGNOSIS — H44113 Panuveitis, bilateral: Secondary | ICD-10-CM | POA: Diagnosis not present

## 2023-02-09 ENCOUNTER — Ambulatory Visit (INDEPENDENT_AMBULATORY_CARE_PROVIDER_SITE_OTHER): Payer: 59 | Admitting: Internal Medicine

## 2023-02-09 VITALS — BP 170/105 | HR 82 | Temp 97.5°F | Ht 67.0 in | Wt 139.0 lb

## 2023-02-09 DIAGNOSIS — K4020 Bilateral inguinal hernia, without obstruction or gangrene, not specified as recurrent: Secondary | ICD-10-CM | POA: Diagnosis not present

## 2023-02-09 DIAGNOSIS — I1A0 Resistant hypertension: Secondary | ICD-10-CM | POA: Diagnosis not present

## 2023-02-09 DIAGNOSIS — I10 Essential (primary) hypertension: Secondary | ICD-10-CM | POA: Diagnosis not present

## 2023-02-09 DIAGNOSIS — E269 Hyperaldosteronism, unspecified: Secondary | ICD-10-CM

## 2023-02-09 MED ORDER — SPIRONOLACTONE 100 MG PO TABS: 100.0000 mg | ORAL_TABLET | Freq: Two times a day (BID) | ORAL | 2 refills | Status: DC

## 2023-02-09 NOTE — Progress Notes (Signed)
Established Patient Office Visit  Subjective:  Patient ID: Christopher Scallon., male    DOB: 12-18-1970  Age: 53 y.o. MRN: [id number]  No chief complaint on file.   C/o worsening discomfort in his hernia and recurrent vomiting. Also here for BP review and medication refills. BP still elevated but denies headaches or dizziness. Denies noncompliance with medications but not strictly adherent to low salt diet.    No other concerns at this time.   Past Medical History:  Diagnosis Date   Aphakia, right    Arthritis    knees   Cataract cortical, senile, left    Chronic kidney disease    CME (cystoid macular edema)    Complication of anesthesia    wakes up during surgery   Failed corneal transplant    GERD (gastroesophageal reflux disease)    History of hiatal hernia    Hyperaldosteronism (HCC)    Hypertension    Hypotony of eye    Macular degeneration    Mature cataract    Panuveitis    Panuveitis    POAG (primary open-angle glaucoma)    Uveitic glaucoma     Past Surgical History:  Procedure Laterality Date   ADRENAL GLAND SURGERY Bilateral 2016   biopsy   CORNEAL TRANSPLANT     EYE SURGERY  2017   fracture repair of left knee     FRACTURE SURGERY     FRACTURED LEFT KNEE   glaucoma eye surgery     lens eye surgery      Social History   Socioeconomic History   Marital status: Married    Spouse name: Not on file   Number of children: Not on file   Years of education: Not on file   Highest education level: Not on file  Occupational History   Not on file  Tobacco Use   Smoking status: Never   Smokeless tobacco: Never  Vaping Use   Vaping status: Never Used  Substance and Sexual Activity   Alcohol use: No   Drug use: No   Sexual activity: Not on file  Other Topics Concern   Not on file  Social History Narrative   Not on file   Social Drivers of Health   Financial Resource Strain: Not on file  Food Insecurity: Not on file  Transportation Needs:  Not on file  Physical Activity: Not on file  Stress: Not on file  Social Connections: Not on file  Intimate Partner Violence: Not on file    Family History  Problem Relation Age of Onset   Allergies Mother    Hypertension Mother    Healthy Father    Cancer Neg Hx     Allergies  Allergen Reactions   Ace Inhibitors Swelling    Lip swelling    Quinapril Swelling    angioedema    Outpatient Medications Prior to Visit  Medication Sig   Ascorbic Acid (VITAMIN C GUMMIE PO) Take 2 tablets by mouth daily.   atorvastatin (LIPITOR) 20 MG tablet Take 1 tablet (20 mg total) by mouth daily.   carvedilol (COREG) 25 MG tablet Take 2 tablets (50 mg total) by mouth 2 (two) times daily.   chlorthalidone (HYGROTON) 50 MG tablet TAKE 1 TABLET BY MOUTH EVERY DAY IN THE MORNING   cloNIDine (CATAPRES - DOSED IN MG/24 HR) 0.3 mg/24hr patch Place 1 patch (0.3 mg total) onto the skin once a week.   dapagliflozin propanediol (FARXIGA) 5 MG TABS tablet Take 5  mg by mouth daily.   ELDERBERRY PO Take 2 tablets by mouth daily.   minoxidil (LONITEN) 2.5 MG tablet Take 3 tablets (7.5 mg total) by mouth 2 (two) times daily.   ondansetron (ZOFRAN) 4 MG tablet Take 4 mg by mouth every 8 (eight) hours as needed for nausea or vomiting.   pantoprazole (PROTONIX) 40 MG tablet Take 40 mg by mouth 2 (two) times daily.   prednisoLONE acetate (PRED FORTE) 1 % ophthalmic suspension Place 1 drop into both eyes every 4 (four) hours.   sucralfate (CARAFATE) 1 g tablet Take 1 g by mouth as needed.   [DISCONTINUED] spironolactone (ALDACTONE) 100 MG tablet Take 1 tablet (100 mg total) by mouth 2 (two) times daily.   cetirizine (ZYRTEC) 10 MG tablet Take 10 mg by mouth daily. (Patient not taking: Reported on 01/19/2023)   cholecalciferol (VITAMIN D3) 25 MCG (1000 UNIT) tablet Take 1,000 Units by mouth daily. (Patient not taking: Reported on 01/19/2023)   NIFEdipine (ADALAT CC) 90 MG 24 hr tablet Take 90 mg by mouth daily.  (Patient  not taking: Reported on 05/11/2022)   potassium chloride SA (KLOR-CON M20) 20 MEQ tablet Take 1 tablet (20 mEq total) by mouth daily. (Patient not taking: Reported on 05/11/2022)   predniSONE (DELTASONE) 10 MG tablet Take 10 mg by mouth daily with breakfast. (Patient not taking: Reported on 01/19/2023)   No facility-administered medications prior to visit.    Review of Systems  Constitutional: Negative.   HENT: Negative.    Eyes: Negative.   Respiratory: Negative.    Cardiovascular: Negative.   Gastrointestinal: Negative.   Genitourinary: Negative.   Skin: Negative.   Neurological: Negative.   Endo/Heme/Allergies: Negative.        Objective:   BP (!) 170/105   Pulse 82   Temp (!) 97.5 F (36.4 C)   Ht 5\' 7"  (1.702 m)   Wt 139 lb (63 kg)   SpO2 98%   BMI 21.77 kg/m   Vitals:   02/09/23 0931  BP: (!) 170/105  Pulse: 82  Temp: (!) 97.5 F (36.4 C)  Height: 5\' 7"  (1.702 m)  Weight: 139 lb (63 kg)  SpO2: 98%  BMI (Calculated): 21.77    Physical Exam Vitals reviewed.  Constitutional:      Appearance: Normal appearance.  HENT:     Head: Normocephalic.     Left Ear: There is no impacted cerumen.     Nose: Nose normal.     Mouth/Throat:     Mouth: Mucous membranes are moist.     Pharynx: No posterior oropharyngeal erythema.  Eyes:     Extraocular Movements: Extraocular movements intact.     Pupils: Pupils are equal, round, and reactive to light.     Comments: Visually impaired  Cardiovascular:     Rate and Rhythm: Regular rhythm.     Chest Wall: PMI is not displaced.     Pulses: Normal pulses.     Heart sounds: Normal heart sounds. No murmur heard. Pulmonary:     Effort: Pulmonary effort is normal.     Breath sounds: Normal air entry. No rhonchi or rales.  Abdominal:     General: Abdomen is flat. Bowel sounds are normal. There is abdominal bruit. There is no distension.     Palpations: Abdomen is soft. There is pulsatile mass (left of midline). There is no  hepatomegaly, splenomegaly or mass.     Tenderness: There is no abdominal tenderness.     Hernia: A  hernia is present. Hernia is present in the left inguinal area and right inguinal area.  Musculoskeletal:        General: Normal range of motion.     Cervical back: Normal range of motion and neck supple.     Right lower leg: No edema.     Left lower leg: No edema.  Skin:    General: Skin is warm and dry.  Neurological:     General: No focal deficit present.     Mental Status: He is alert and oriented to person, place, and time.     Cranial Nerves: No cranial nerve deficit.     Motor: No weakness.  Psychiatric:        Mood and Affect: Mood normal.        Behavior: Behavior normal.      No results found for any visits on 02/09/23.      Assessment & Plan:  As per problem list. Instructed to follow low salt diet.  Problem List Items Addressed This Visit       Cardiovascular and Mediastinum   Essential hypertension - Primary   Relevant Medications   spironolactone (ALDACTONE) 100 MG tablet   HTN (hypertension)   Relevant Medications   spironolactone (ALDACTONE) 100 MG tablet     Endocrine   Hyperaldosteronism (HCC)   Relevant Medications   spironolactone (ALDACTONE) 100 MG tablet   Other Visit Diagnoses       Bilateral inguinal hernia without obstruction or gangrene, recurrence not specified       Relevant Orders   Ambulatory referral to General Surgery       Return in about 3 months (around 05/10/2023) for fu with labs prior, BP followup.   Total time spent: 20 minutes  Luna Fuse, MD  02/09/2023   This document may have been prepared by [facility] Voice Recognition software and as such may include unintentional dictation errors.

## 2023-03-04 ENCOUNTER — Encounter (INDEPENDENT_AMBULATORY_CARE_PROVIDER_SITE_OTHER): Payer: 59 | Admitting: Vascular Surgery

## 2023-03-05 DIAGNOSIS — K219 Gastro-esophageal reflux disease without esophagitis: Secondary | ICD-10-CM | POA: Insufficient documentation

## 2023-03-05 DIAGNOSIS — E785 Hyperlipidemia, unspecified: Secondary | ICD-10-CM | POA: Insufficient documentation

## 2023-03-05 DIAGNOSIS — I714 Abdominal aortic aneurysm, without rupture, unspecified: Secondary | ICD-10-CM | POA: Insufficient documentation

## 2023-03-05 NOTE — Progress Notes (Deleted)
 MRN : [id number]  Lawrnce Mcdaniel. is a 53 y.o. ([birth year]) male who presents with chief complaint of check circulation.  History of Present Illness:   The patient presents to the office for evaluation of an abdominal aortic aneurysm. The aneurysm was found incidentally by CT scan. Patient denies abdominal pain or unusual back pain, no other abdominal complaints.  No history of an abrupt onset of a painful toe associated with blue discoloration.     No family history of AAA.   Patient denies amaurosis fugax or TIA symptoms. There is no history of claudication or rest pain symptoms of the lower extremities.  The patient denies angina or shortness of breath.  Duplex ultrasound scan dated 01/26/2023 shows an AAA that measures 3.7 cm  No outpatient medications have been marked as taking for the 03/07/23 encounter (Appointment) with Gilda Crease, Latina Craver, MD.    Past Medical History:  Diagnosis Date   Aphakia, right    Arthritis    knees   Cataract cortical, senile, left    Chronic kidney disease    CME (cystoid macular edema)    Complication of anesthesia    wakes up during surgery   Failed corneal transplant    GERD (gastroesophageal reflux disease)    History of hiatal hernia    Hyperaldosteronism (HCC)    Hypertension    Hypotony of eye    Macular degeneration    Mature cataract    Panuveitis    Panuveitis    POAG (primary open-angle glaucoma)    Uveitic glaucoma     Past Surgical History:  Procedure Laterality Date   ADRENAL GLAND SURGERY Bilateral 2016   biopsy   CORNEAL TRANSPLANT     EYE SURGERY  2017   fracture repair of left knee     FRACTURE SURGERY     FRACTURED LEFT KNEE   glaucoma eye surgery     lens eye surgery      Social History Social History   Tobacco Use   Smoking status: Never   Smokeless tobacco: Never  Vaping Use   Vaping status: Never Used  Substance Use Topics    Alcohol use: No   Drug use: No    Family History Family History  Problem Relation Age of Onset   Allergies Mother    Hypertension Mother    Healthy Father    Cancer Neg Hx     Allergies  Allergen Reactions   Ace Inhibitors Swelling    Lip swelling    Quinapril Swelling    angioedema     REVIEW OF SYSTEMS (Negative unless checked)  Constitutional: [] Weight loss  [] Fever  [] Chills Cardiac: [] Chest pain   [] Chest pressure   [] Palpitations   [] Shortness of breath when laying flat   [] Shortness of breath with exertion. Vascular:  [x] Pain in legs with walking   [] Pain in legs at rest  [] History of DVT   [] Phlebitis   [] Swelling in legs   [] Varicose veins   [] Non-healing ulcers Pulmonary:   [] Uses home oxygen   [] Productive cough   [] Hemoptysis   []   Wheeze  [] COPD   [] Asthma Neurologic:  [] Dizziness   [] Seizures   [] History of stroke   [] History of TIA  [] Aphasia   [] Vissual changes   [] Weakness or numbness in arm   [] Weakness or numbness in leg Musculoskeletal:   [] Joint swelling   [] Joint pain   [] Low back pain Hematologic:  [] Easy bruising  [] Easy bleeding   [] Hypercoagulable state   [] Anemic Gastrointestinal:  [] Diarrhea   [] Vomiting  [] Gastroesophageal reflux/heartburn   [] Difficulty swallowing. Genitourinary:  [] Chronic kidney disease   [] Difficult urination  [] Frequent urination   [] Blood in urine Skin:  [] Rashes   [] Ulcers  Psychological:  [] History of anxiety   []  History of major depression.  Physical Examination  There were no vitals filed for this visit. There is no height or weight on file to calculate BMI. Gen: WD/WN, NAD Head: [place]/AT, No temporalis wasting.  Ear/Nose/Throat: Hearing grossly intact, nares w/o erythema or drainage Eyes: PER, EOMI, sclera nonicteric.  Neck: Supple, no masses.  No bruit or JVD.  Pulmonary:  Good air movement, no audible wheezing, no use of accessory muscles.  Cardiac: RRR, normal S1, S2, no Murmurs. Vascular:  mild trophic changes,  no open wounds Vessel Right Left  Radial Palpable Palpable  PT Not Palpable Not Palpable  DP Not Palpable Not Palpable  Gastrointestinal: soft, non-distended. No guarding/no peritoneal signs.  Musculoskeletal: M/S 5/5 throughout.  No visible deformity.  Neurologic: CN 2-12 intact. Pain and light touch intact in extremities.  Symmetrical.  Speech is fluent. Motor exam as listed above. Psychiatric: Judgment intact, Mood & affect appropriate for pt's clinical situation. Dermatologic: No rashes or ulcers noted.  No changes consistent with cellulitis.   CBC Lab Results  Component Value Date   WBC 10.3 01/18/2023   HGB 12.0 (L) 01/18/2023   HCT 36.7 (L) 01/18/2023   MCV 94 01/18/2023   PLT 258 01/18/2023    BMET    Component Value Date/Time   NA 141 01/18/2023 1028   K 5.2 01/18/2023 1028   CL 104 01/18/2023 1028   CO2 25 01/18/2023 1028   GLUCOSE 106 (H) 01/18/2023 1028   GLUCOSE 160 (H) 10/02/2019 2152   BUN 43 (H) 01/18/2023 1028   CREATININE 3.67 (H) 01/18/2023 1028   CREATININE 1.92 (H) 05/29/2012 1341   CALCIUM 9.2 01/18/2023 1028   GFRNONAA 38 (L) 10/02/2019 2152   GFRNONAA 42 (L) 05/29/2012 1341   GFRAA 44 (L) 10/02/2019 2152   GFRAA 49 (L) 05/29/2012 1341   CrCl cannot be calculated (Patient's most recent lab result is older than the maximum 21 days allowed.).  COAG Lab Results  Component Value Date   INR 0.98 12/14/2016    Radiology No results found.   Assessment/Plan There are no diagnoses linked to this encounter.   Levora Dredge, MD  03/05/2023 4:19 PM

## 2023-03-07 ENCOUNTER — Encounter (INDEPENDENT_AMBULATORY_CARE_PROVIDER_SITE_OTHER): Payer: 59 | Admitting: Vascular Surgery

## 2023-03-07 DIAGNOSIS — I7143 Infrarenal abdominal aortic aneurysm, without rupture: Secondary | ICD-10-CM

## 2023-03-07 DIAGNOSIS — E782 Mixed hyperlipidemia: Secondary | ICD-10-CM

## 2023-03-07 DIAGNOSIS — K219 Gastro-esophageal reflux disease without esophagitis: Secondary | ICD-10-CM

## 2023-03-07 DIAGNOSIS — I10 Essential (primary) hypertension: Secondary | ICD-10-CM

## 2023-03-10 DIAGNOSIS — E790 Hyperuricemia without signs of inflammatory arthritis and tophaceous disease: Secondary | ICD-10-CM | POA: Diagnosis not present

## 2023-03-10 DIAGNOSIS — I10 Essential (primary) hypertension: Secondary | ICD-10-CM | POA: Diagnosis not present

## 2023-03-10 DIAGNOSIS — I129 Hypertensive chronic kidney disease with stage 1 through stage 4 chronic kidney disease, or unspecified chronic kidney disease: Secondary | ICD-10-CM | POA: Diagnosis not present

## 2023-03-10 DIAGNOSIS — E2609 Other primary hyperaldosteronism: Secondary | ICD-10-CM | POA: Diagnosis not present

## 2023-03-10 DIAGNOSIS — N184 Chronic kidney disease, stage 4 (severe): Secondary | ICD-10-CM | POA: Diagnosis not present

## 2023-03-10 DIAGNOSIS — D631 Anemia in chronic kidney disease: Secondary | ICD-10-CM | POA: Diagnosis not present

## 2023-03-17 ENCOUNTER — Ambulatory Visit: Payer: Self-pay | Admitting: General Surgery

## 2023-03-17 DIAGNOSIS — K4020 Bilateral inguinal hernia, without obstruction or gangrene, not specified as recurrent: Secondary | ICD-10-CM | POA: Diagnosis not present

## 2023-03-17 NOTE — H&P (View-Only) (Signed)
 PATIENT PROFILE: Christopher Mcdaniel. is a 53 y.o. male who presents to the Clinic for consultation at the request of Dr. Ellsworth Lennox for evaluation of bilateral inguinal hernia.  PCP:  Christopher Main, MD  History of Present Illness Christopher Mcdaniel. is a 53 year old male who presents with bilateral inguinal hernia.  He has bilateral inguinal hernias that have been increasing in size and causing more discomfort. The right side hernia is particularly large compared to the left, indicating a significant defect in the abdominal wall.  The left side was previously accessed for an adrenal gland sampling procedure related to a past investigation of his blood pressure, which may have contributed to tissue weakness in that area.  He has not had any other abdominal surgeries. No associated symptoms such as fever or changes in bowel habits.  Of note, patient has been evaluated by myself since August 2021.  At that time patient was having significant issues with his vision and eyes and he decided to proceed with treatment for his corneal edema.  He endorses that he has finally taken care of her eyes and he is ready to proceed with bilateral inguinal hernia repair.   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   Stage 3b chronic kidney disease (CMS/HHS-HCC) 03/27/2021   Non-recurrent bilateral inguinal hernia without obstruction or gangrene 03/08/2016   Transplanted cornea 10/10/2015   Age-related nuclear cataract of left eye 06/17/2015   Cataract cortical, senile, left 06/17/2015   Overview    Added automatically from request for surgery [id number]      Hyperaldosteronism (CMS/HHS-HCC) 05/22/2015   Hypertension, secondary 05/22/2015   Secondary corneal edema of right eye 04/18/2015   Poor hypertension control 07/18/2014   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   Cystoid macular edema of right eye 05/17/2014   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   Essential hypertension 05/17/2014   Hypotony of eye - Right Eye 05/17/2014   Aphakia, right 05/17/2014   CME (cystoid macular edema) 03/30/2013   Serous macular detachment of left eye 03/30/2013   HTN (hypertension) 09/24/2011   Overview    Monitors at home treated with medication      POAG (primary open-angle glaucoma) 08/31/2011   Panuveitis Unknown   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   Mature cataract left eye 08/30/2011    GENERAL REVIEW OF SYSTEMS:   General ROS: negative for - chills, fatigue, fever, weight gain or weight loss Allergy and Immunology ROS: negative for - hives  Hematological and Lymphatic ROS: negative for - bleeding problems or bruising, negative for palpable nodes Endocrine ROS: negative for - heat or cold intolerance, hair changes Respiratory ROS: negative for - cough, shortness of breath or wheezing Cardiovascular ROS: no chest pain or palpitations GI ROS: negative for nausea, vomiting, abdominal pain, diarrhea, constipation Musculoskeletal ROS: negative for - joint swelling or muscle pain Neurological ROS: negative for - confusion, syncope Dermatological ROS: negative for pruritus and rash Psychiatric: negative for anxiety, depression, difficulty sleeping and memory loss  MEDICATIONS: Current Outpatient Medications  Medication Sig Dispense Refill   acetaminophen (TYLENOL) 325 MG tablet Take 650 mg by mouth every 4 (four) hours as needed for Pain     allopurinoL (ZYLOPRIM) 100 MG tablet Take 100 mg by mouth once daily     atorvastatin (LIPITOR) 20 MG tablet Take 20 mg by mouth once daily  carvediloL (COREG) 25 MG tablet Take 1 tablet by mouth 2 (two) times daily     cetirizine (ZYRTEC) 10 MG tablet Take 10 mg by mouth as needed     chlorthalidone 50 MG tablet Take 50 mg by mouth every morning.       cloNIDine (CATAPRES-TTS) 0.3 mg/24 hr patch      dapagliflozin propanediol (FARXIGA) 5 mg Tab tablet Take 5 mg by mouth once daily     doxazosin  (CARDURA) 2 MG tablet Take 2 mg by mouth at bedtime     minoxidiL 2.5 MG tablet as directed     ondansetron (ZOFRAN) 4 MG tablet      pantoprazole (PROTONIX) 40 MG DR tablet once daily     prednisoLONE acetate (PRED FORTE) 1 % ophthalmic suspension Place 1 drop into the right eye 2 (two) times daily 1 drop 6 times a day left eye 10 mL 5   spironolactone (ALDACTONE) 100 MG tablet Take 100 mg by mouth once daily     carvediloL (COREG) 25 MG tablet Take by mouth (Patient not taking: Reported on 03/17/2023)     chlorthalidone 50 MG tablet Take by mouth (Patient not taking: Reported on 03/17/2023)     cloNIDine (CATAPRES-TTS) 0.2 mg/24 hr patch Place 1 patch onto the skin once a week (Patient not taking: Reported on 03/17/2023)     Compound Medication Place 1 drop into the left eye 4 (four) times daily Med Name: Tacrolimus ophthalmic solution (Patient not taking: Reported on 02/01/2022) 7.5 each 5   EPINEPHrine (EPIPEN JR) 0.15 mg/0.3 mL auto-injector Inject into the muscle (Patient not taking: Reported on 03/17/2023)     ketorolac (ACULAR LS) 0.4 % ophthalmic solution Place 1 drop into the left eye 4 (four) times daily (Patient not taking: Reported on 03/30/2022) 5 mL 0   ketorolac (ACULAR) 0.5 % ophthalmic solution Place 1 drop into the left eye 4 (four) times daily For after surgery (Patient not taking: Reported on 03/17/2023) 10 mL 1   minoxidiL 2.5 MG tablet Take 2.5 mg by mouth 2 (two) times daily (Patient not taking: Reported on 07/05/2022)     mycophenolate (CELLCEPT) 500 mg tablet Take 2 tablets (1,000 mg total) by mouth every 12 (twelve) hours (Patient not taking: Reported on 03/17/2023) 120 tablet 11   niFEdipine (ADALAT CC) 90 MG ER tablet Take 1 tablet by mouth once daily (Patient not taking: Reported on 07/06/2021)     ondansetron (ZOFRAN-ODT) 4 MG disintegrating tablet Take 1 tablet by mouth every 4 (four) hours as needed (Patient not taking: Reported on 09/06/2022)  0   pantoprazole (PROTONIX) 40 MG DR  tablet TAKE 1 TABLET (40 MG TOTAL) BY MOUTH 2 (TWO) TIMES DAILY. TAKE 30 MIN BEFORE MEALS. (Patient not taking: Reported on 09/06/2022) 60 tablet 1   prednisoLONE acetate (PRED FORTE) 1 % ophthalmic suspension Place 1 drop into the right eye 6 (six) times daily To start after surgery (Patient not taking: Reported on 09/06/2022) 10 mL 1   prednisoLONE acetate (PRED FORTE) 1 % ophthalmic suspension Place 1 drop into the right eye every 1 hour (Patient not taking: Reported on 09/06/2022) 10 mL 2   prednisoLONE acetate (PRED FORTE) 1 % ophthalmic suspension Place 1 drop into the left eye 6 (six) times daily starting after surgery (Patient not taking: Reported on 09/06/2022) 10 mL 5   prednisoLONE acetate (PRED FORTE) 1 % ophthalmic suspension Place 1 drop into the right eye 6 (six) times daily starting  after surgery (Patient not taking: Reported on 09/06/2022) 10 mL 5   predniSONE (DELTASONE) 10 MG tablet TAKE 2 TABLETS BY MOUTH ONCE DAILY. (Patient not taking: Reported on 03/30/2022) 60 tablet 0   predniSONE (DELTASONE) 10 MG tablet Take 2 tablets (20 mg total) by mouth once daily (Patient not taking: Reported on 07/05/2022) 60 tablet 0   predniSONE (DELTASONE) 10 MG tablet Take 1 tablet (10 mg total) by mouth once daily Oral Prednisone Taper Chart: 15 mg (1 and a half tabs)/day x 2 week, 10 mg (1 tabs)/day. Stay at this dose (Patient not taking: Reported on 09/06/2022) 30 tablet 2   predniSONE (DELTASONE) 10 MG tablet take 1 tablet by mouth every day (Patient not taking: Reported on 03/17/2023) 30 tablet 1   predniSONE (DELTASONE) 20 MG tablet Take 1 tablet (20 mg total) by mouth once daily Take 1/2 tablet by mouth once daily (Patient not taking: Reported on 07/05/2022) 100 tablet 1   spironolactone (ALDACTONE) 100 MG tablet Take 1 tablet (100 mg total) by mouth 2 (two) times daily (Patient not taking: Reported on 09/06/2022)     sucralfate (CARAFATE) 1 gram tablet Take 1 g by mouth 4 (four) times daily (Patient not  taking: Reported on 05/03/2022)     timoloL maleate (TIMOPTIC) 0.5 % ophthalmic solution Place 1 drop into the right eye 2 (two) times daily (Patient not taking: Reported on 10/01/2021) 10 mL 11   No current facility-administered medications for this visit.    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  PAST SURGICAL HISTORY: Past Surgical History:  Procedure Laterality Date   REPAIR OF FRACTURED LT KNEE  01/11/1986   GLAUCOMA EYE SURGERY Right 03/16/2004   Ah[facility] x 2   VITREOUS RETINAL SURGERY Right 06/13/2006   ant and post membranectomy and PPV    LENS EYE SURGERY Right 06/16/2007   Membranectomy, IOL removal, and retisert implant OD    VITREOUS RETINAL SURGERY Right 06/16/2007   Membranectomy, IOL removal, and retisert implant OD   ANTERIOR SEGMENT EYE SURGERY Right 06/29/2007   fluocinolone implant OD   CORNEAL EYE SURGERY Right 01/21/2011   PK OD    GLAUCOMA EYE SURGERY Right 05/24/2011   revisionof aqueous shunt    TUBE REVISION W/SCLERA & AMNIOTIC MEMBRANE GRAFTING Right 09/27/2011   Procedure: TUBE REVISION W/SCLERA & AMNIOTIC MEMBRANE GRAFTING;  Surgeon: Bettye Boeck, MD;  [facility];  Service: Ophthalmology;  Laterality: Right;   DESTRUCTION CILIARY BODY BY CYCLOPHOTOCOAGULATION Right 07/19/2014   Procedure: Right eye micropulse (TDC) ;  Surgeon: Bettye Boeck, MD;  [facility];  Service: Ophthalmology;   Laterality: Right;  PAT phone call [phone]   CORNEAL TRANSPLANT PKP PSEUDOPHAKIC Right 06/26/2015   Procedure: CORNEAL TRANSPLANT PKP PSEUDOPHAKIC;  Surgeon: Timoteo Ace, MD;  [facility];  Service: Ophthalmology;  Laterality: Right;  Tissue ordered 05/21/15 MIS, ch   IMPLANTATION INTRAVITREAL DRUG DELIVERY SYSTEM Right 06/26/2015  Procedure: IMPLANTATION OF INTRAVITREAL DRUG DELIVERY SYSTEM (EG, GANCICLOVIR IMPLANT), INCLUDES CONCOMITANT REMOVAL OF VITREOUS;  Surgeon: Leandrew Koyanagi, MD;  [facility];  Service: Ophthalmology;  Laterality: Right;   CORNEAL TRANSPLANT IN APHAKIA Right 12/23/2020   Procedure: PK (Aphakia) -KERATOPLASTY, PENETRATING (IN APHAKIA) (CORNEAL TRANSPLANT);  Surgeon: Robyn Haber, Karl Luke, MD;  [facility];  Service: Ophthalmology;  Laterality: Right;   REVISION AQUEOUS SHUNT Right 12/23/2020   Procedure: REVISION OF TUBE AQUEOUS SHUNT TO EXTRAOCULAR EQUATORIAL PLATE RESERVOIR; WITH GRAFT;  Surgeon: Bettye Boeck, MD;  [facility];  Service: Ophthalmology;  Laterality: Right;   CORNEAL TRANSPLANT PKP PHAKIC Right 07/21/2021   Procedure: PK (Phakic) -   KERATOPLASTY, PENETRATING (EXCEPT IN APHAKIA OR PSEUDOPHAKIA) (CORNEAL TRANSPLANT);  Surgeon: Robyn Haber, Karl Luke, MD;  [facility];  Service: Ophthalmology;  Laterality: Right;   CORNEAL TRANSPLANT PKP PHAKIC Left 02/18/2022   Procedure: PK (Phakic) -  KERATOPLASTY, PENETRATING (EXCEPT IN APHAKIA OR PSEUDOPHAKIA) (CORNEAL TRANSPLANT);  Surgeon: Phylliss Bob, MD;  [facility];  Service: Ophthalmology;  Laterality: Left;   EXTRACTION CATARACT INTRACAPSULAR W/INSERTION INTRAOCULAR PROSTHESIS Left 02/18/2022   Procedure: EXTRACAPSULAR CATARACT ECCE REMOVAL WITH INSERTION OF INTRAOCULAR LENS PROSTHESIS (1 STAGE PROCEDURE), MANUAL OR MECHANICAL TECHNIQUE; WITHOUT ENDOSCOPIC CYCLOPHOTOCOAGULATION;  Surgeon: Phylliss Bob, MD;  [facility];  Service: Ophthalmology;  Laterality: Left;   REPAIR IRIS & CILIARY BODY Left 02/18/2022   Procedure: REPAIR OF IRIS, CILIARY BODY (AS FOR IRIDODIALYSIS);  Surgeon: Phylliss Bob, MD;  [facility];  Service: Ophthalmology;  Laterality: Left;   VITRECTOMY MECHANICAL PARS PLANA Left 02/18/2022   Procedure: VITRECTOMY; 23G, MECHANICAL, PARS PLANA APPROACH;  Surgeon: Dalbert Mayotte, MD;  [facility];  Service: Ophthalmology;  Laterality: Left;   EYE SURGERY     GLAUCOMA EYE SURGERY Right    PI    LENS EYE SURGERY Right    unknown   REMOVAL SECONDARY MEMBRANOUS CATARACT Right      FAMILY HISTORY: Family History  Problem Relation Name Age of Onset   No Known Problems Mother     High blood pressure (Hypertension) Father     No Known Problems Sister     No Known Problems Brother     Cataracts Maternal Grandmother     High blood pressure (Hypertension) Paternal Grandmother     High blood pressure (Hypertension) Paternal Grandfather     Glaucoma Neg Hx     Macular degeneration Neg Hx     Diabetes Neg Hx     Blindness Neg Hx     Anesthesia problems Neg Hx     Malignant hyperthermia Neg Hx     Strabismus Neg Hx       SOCIAL HISTORY: Social History   Socioeconomic History   Marital status: Single  Tobacco Use   Smoking status: Never   Smokeless tobacco: Never  Vaping Use   Vaping status: Never Used  Substance and Sexual Activity   Alcohol use: No    Alcohol/week: 0.0 standard drinks of alcohol   Drug use: No   Sexual activity: Yes    Partners: Female  Social History Narrative   Married with a son    PHYSICAL EXAM: Vitals:   03/17/23 1022  BP: (!) 146/85  Pulse: 87   Body mass index is 23.34 kg/m. Weight: 67.6 kg (149 lb)   GENERAL: Alert, active, oriented x3  HEENT: Pupils equal reactive  to light. Extraocular movements are intact. Sclera clear. Palpebral conjunctiva normal red color.Pharynx  clear.  NECK: Supple with no palpable mass and no adenopathy.  LUNGS: Sound clear with no rales rhonchi or wheezes.  HEART: Regular rhythm S1 and S2 without murmur.  ABDOMEN: Soft and depressible, nontender with no palpable mass, no hepatomegaly.  Large right, left moderately sized reducible inguinal hernias.  EXTREMITIES: Well-developed well-nourished symmetrical with no dependent edema.  NEUROLOGICAL: Awake alert oriented, facial expression symmetrical, moving all extremities.  REVIEW OF DATA: I have reviewed the following data today: No visits with results within 3 Month(s) from this visit.  Latest known visit with results is:  Office Visit on 10/21/2022  Component Date Value   T. Pallidum Antibody (Sy* 10/21/2022 Negative    NIL 10/21/2022 0.01    TB 1 Antigen 10/21/2022 0.02    TB 2 Antigen 10/21/2022 0.01    Mitogen 10/21/2022 10.00    Quantiferon TB 10/21/2022 Negative    Angiotensin-Converting E* 10/21/2022 37    Lysozyme, Serum 10/21/2022 13.7 (H)    Sodium 10/21/2022 138    Potassium 10/21/2022 4.3    Chloride 10/21/2022 103    Carbon Dioxide (CO2) 10/21/2022 26    Urea Nitrogen (BUN) 10/21/2022 40 (H)    Creatinine 10/21/2022 2.9 (H)    Glucose 10/21/2022 132    Calcium 10/21/2022 9.4    AST (Aspartate Aminotran* 10/21/2022 15    ALT (Alanine Aminotransf* 10/21/2022 11 (L)    Bilirubin, Total 10/21/2022 0.5    Alk Phos (Alkaline Phosp* 10/21/2022 53    Albumin 10/21/2022 3.5    Protein, Total 10/21/2022 7.4    Anion Gap 10/21/2022 9    BUN/CREA Ratio 10/21/2022 14    Glomerular Filtration Ra* 10/21/2022 25    WBC (White Blood Cell Co* 10/21/2022 12.0 (H)    Hemoglobin 10/21/2022 13.0 (L)    Hematocrit 10/21/2022 38.8 (L)    Platelets 10/21/2022 219    MCV (Mean Corpuscular Vo* 10/21/2022 93    MCH (Mean Corpuscular He* 10/21/2022 31.0    MCHC (Mean Corpuscular H* 10/21/2022 33.5    RBC (Red Blood Cell Coun* 10/21/2022 4.19 (L)    RDW-CV (Red Cell  Distrib* 10/21/2022 14.9 (H)    NRBC (Nucleated Red Bloo* 10/21/2022 0.00    NRBC % (Nucleated Red Bl* 10/21/2022 0.0    MPV (Mean Platelet Volum* 10/21/2022 12.3 (H)    Neutrophil Count 10/21/2022 9.8 (H)    Neutrophil % 10/21/2022 81.8 (H)    Lymphocyte Count 10/21/2022 1.6    Lymphocyte % 10/21/2022 13.0    Monocyte Count 10/21/2022 0.5    Monocyte % 10/21/2022 3.7    Eosinophil Count 10/21/2022 0.09    Eosinophil % 10/21/2022 0.7    Basophil Count 10/21/2022 0.05    Basophil % 10/21/2022 0.4    Immature Granulocyte Cou* 10/21/2022 0.05    Immature Granulocyte % 10/21/2022 0.4    Hepatitis B Core Antibod* 10/21/2022 NonReactive    Hepatitis B Surface Anti* 10/21/2022 NonReactive    Hepatitis B Surface Anti* 10/21/2022 Negative    Hepatitis B Surface Anti* 10/21/2022 3.60    NIL Rflx 10/21/2022 0.01    TB 1 Antigen Rflx 10/21/2022 0.02    TB 2 Antigen Rflx 10/21/2022 0.01    Mitogen Rflx 10/21/2022 10.00    QFT Rflx Instrument Trig* 10/21/2022 1.00     Results    Assessment & Plan Bilateral inguinal hernia   Bilateral inguinal hernias are present, with the right  side significantly larger and causing increased discomfort due to a notable defect in the abdominal wall.  Surgical repair is necessary as hernias do not resolve without intervention. A laparoscopic repair is planned. Schedule the procedure with three small incisions to address both hernias. Advise no lifting for at least two weeks post-surgery, potentially extending to four weeks based on recovery. Instruct on normal post-operative fluid buildup, which will resolve over time. Prescribe pain medication for post-operative management, with acetaminophen or ibuprofen for mild pain. Recommend ice packs for swelling and bruising in the groin area. Advise on normal showering post-surgery with no special wound care needed due to skin glue on incisions. Plan a follow-up visit two weeks post-surgery to assess recovery and discuss  activity level.  The patient was oriented about the treatment alternatives (observation vs surgical repair). Due to patient symptoms, repair is recommended. Patient oriented about the surgical procedure, the use of mesh and its risk of complications such as: infection, bleeding, injury to vas deference, vasculature and testicle, injury to bowel or bladder, and chronic pain.   Non-recurrent bilateral inguinal hernia without obstruction or gangrene [K40.20]   Patient verbalized understanding, all questions were answered, and were agreeable with the plan outlined above.    Carolan Shiver, MD  Electronically signed by Carolan Shiver, MD

## 2023-03-17 NOTE — H&P (Signed)
 PATIENT PROFILE: Christopher Sedler. is a 53 y.o. male who presents to the Clinic for consultation at the request of Dr. Ellsworth Lennox for evaluation of bilateral inguinal hernia.  PCP:  Christopher Main, MD  History of Present Illness Christopher Mcdaniel. is a 53 year old male who presents with bilateral inguinal hernia.  He has bilateral inguinal hernias that have been increasing in size and causing more discomfort. The right side hernia is particularly large compared to the left, indicating a significant defect in the abdominal wall.  The left side was previously accessed for an adrenal gland sampling procedure related to a past investigation of his blood pressure, which may have contributed to tissue weakness in that area.  He has not had any other abdominal surgeries. No associated symptoms such as fever or changes in bowel habits.  Of note, patient has been evaluated by myself since August 2021.  At that time patient was having significant issues with his vision and eyes and he decided to proceed with treatment for his corneal edema.  He endorses that he has finally taken care of her eyes and he is ready to proceed with bilateral inguinal hernia repair.   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   Stage 3b chronic kidney disease (CMS/HHS-HCC) 03/27/2021   Non-recurrent bilateral inguinal hernia without obstruction or gangrene 03/08/2016   Transplanted cornea 10/10/2015   Age-related nuclear cataract of left eye 06/17/2015   Cataract cortical, senile, left 06/17/2015   Overview    Added automatically from request for surgery [id number]      Hyperaldosteronism (CMS/HHS-HCC) 05/22/2015   Hypertension, secondary 05/22/2015   Secondary corneal edema of right eye 04/18/2015   Poor hypertension control 07/18/2014   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   Cystoid macular edema of right eye 05/17/2014   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   Essential hypertension 05/17/2014   Hypotony of eye - Right Eye 05/17/2014   Aphakia, right 05/17/2014   CME (cystoid macular edema) 03/30/2013   Serous macular detachment of left eye 03/30/2013   HTN (hypertension) 09/24/2011   Overview    Monitors at home treated with medication      POAG (primary open-angle glaucoma) 08/31/2011   Panuveitis Unknown   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   Mature cataract left eye 08/30/2011    GENERAL REVIEW OF SYSTEMS:   General ROS: negative for - chills, fatigue, fever, weight gain or weight loss Allergy and Immunology ROS: negative for - hives  Hematological and Lymphatic ROS: negative for - bleeding problems or bruising, negative for palpable nodes Endocrine ROS: negative for - heat or cold intolerance, hair changes Respiratory ROS: negative for - cough, shortness of breath or wheezing Cardiovascular ROS: no chest pain or palpitations GI ROS: negative for nausea, vomiting, abdominal pain, diarrhea, constipation Musculoskeletal ROS: negative for - joint swelling or muscle pain Neurological ROS: negative for - confusion, syncope Dermatological ROS: negative for pruritus and rash Psychiatric: negative for anxiety, depression, difficulty sleeping and memory loss  MEDICATIONS: Current Outpatient Medications  Medication Sig Dispense Refill   acetaminophen (TYLENOL) 325 MG tablet Take 650 mg by mouth every 4 (four) hours as needed for Pain     allopurinoL (ZYLOPRIM) 100 MG tablet Take 100 mg by mouth once daily     atorvastatin (LIPITOR) 20 MG tablet Take 20 mg by mouth once daily  carvediloL (COREG) 25 MG tablet Take 1 tablet by mouth 2 (two) times daily     cetirizine (ZYRTEC) 10 MG tablet Take 10 mg by mouth as needed     chlorthalidone 50 MG tablet Take 50 mg by mouth every morning.       cloNIDine (CATAPRES-TTS) 0.3 mg/24 hr patch      dapagliflozin propanediol (FARXIGA) 5 mg Tab tablet Take 5 mg by mouth once daily     doxazosin  (CARDURA) 2 MG tablet Take 2 mg by mouth at bedtime     minoxidiL 2.5 MG tablet as directed     ondansetron (ZOFRAN) 4 MG tablet      pantoprazole (PROTONIX) 40 MG DR tablet once daily     prednisoLONE acetate (PRED FORTE) 1 % ophthalmic suspension Place 1 drop into the right eye 2 (two) times daily 1 drop 6 times a day left eye 10 mL 5   spironolactone (ALDACTONE) 100 MG tablet Take 100 mg by mouth once daily     carvediloL (COREG) 25 MG tablet Take by mouth (Patient not taking: Reported on 03/17/2023)     chlorthalidone 50 MG tablet Take by mouth (Patient not taking: Reported on 03/17/2023)     cloNIDine (CATAPRES-TTS) 0.2 mg/24 hr patch Place 1 patch onto the skin once a week (Patient not taking: Reported on 03/17/2023)     Compound Medication Place 1 drop into the left eye 4 (four) times daily Med Name: Tacrolimus ophthalmic solution (Patient not taking: Reported on 02/01/2022) 7.5 each 5   EPINEPHrine (EPIPEN JR) 0.15 mg/0.3 mL auto-injector Inject into the muscle (Patient not taking: Reported on 03/17/2023)     ketorolac (ACULAR LS) 0.4 % ophthalmic solution Place 1 drop into the left eye 4 (four) times daily (Patient not taking: Reported on 03/30/2022) 5 mL 0   ketorolac (ACULAR) 0.5 % ophthalmic solution Place 1 drop into the left eye 4 (four) times daily For after surgery (Patient not taking: Reported on 03/17/2023) 10 mL 1   minoxidiL 2.5 MG tablet Take 2.5 mg by mouth 2 (two) times daily (Patient not taking: Reported on 07/05/2022)     mycophenolate (CELLCEPT) 500 mg tablet Take 2 tablets (1,000 mg total) by mouth every 12 (twelve) hours (Patient not taking: Reported on 03/17/2023) 120 tablet 11   niFEdipine (ADALAT CC) 90 MG ER tablet Take 1 tablet by mouth once daily (Patient not taking: Reported on 07/06/2021)     ondansetron (ZOFRAN-ODT) 4 MG disintegrating tablet Take 1 tablet by mouth every 4 (four) hours as needed (Patient not taking: Reported on 09/06/2022)  0   pantoprazole (PROTONIX) 40 MG DR  tablet TAKE 1 TABLET (40 MG TOTAL) BY MOUTH 2 (TWO) TIMES DAILY. TAKE 30 MIN BEFORE MEALS. (Patient not taking: Reported on 09/06/2022) 60 tablet 1   prednisoLONE acetate (PRED FORTE) 1 % ophthalmic suspension Place 1 drop into the right eye 6 (six) times daily To start after surgery (Patient not taking: Reported on 09/06/2022) 10 mL 1   prednisoLONE acetate (PRED FORTE) 1 % ophthalmic suspension Place 1 drop into the right eye every 1 hour (Patient not taking: Reported on 09/06/2022) 10 mL 2   prednisoLONE acetate (PRED FORTE) 1 % ophthalmic suspension Place 1 drop into the left eye 6 (six) times daily starting after surgery (Patient not taking: Reported on 09/06/2022) 10 mL 5   prednisoLONE acetate (PRED FORTE) 1 % ophthalmic suspension Place 1 drop into the right eye 6 (six) times daily starting  after surgery (Patient not taking: Reported on 09/06/2022) 10 mL 5   predniSONE (DELTASONE) 10 MG tablet TAKE 2 TABLETS BY MOUTH ONCE DAILY. (Patient not taking: Reported on 03/30/2022) 60 tablet 0   predniSONE (DELTASONE) 10 MG tablet Take 2 tablets (20 mg total) by mouth once daily (Patient not taking: Reported on 07/05/2022) 60 tablet 0   predniSONE (DELTASONE) 10 MG tablet Take 1 tablet (10 mg total) by mouth once daily Oral Prednisone Taper Chart: 15 mg (1 and a half tabs)/day x 2 week, 10 mg (1 tabs)/day. Stay at this dose (Patient not taking: Reported on 09/06/2022) 30 tablet 2   predniSONE (DELTASONE) 10 MG tablet take 1 tablet by mouth every day (Patient not taking: Reported on 03/17/2023) 30 tablet 1   predniSONE (DELTASONE) 20 MG tablet Take 1 tablet (20 mg total) by mouth once daily Take 1/2 tablet by mouth once daily (Patient not taking: Reported on 07/05/2022) 100 tablet 1   spironolactone (ALDACTONE) 100 MG tablet Take 1 tablet (100 mg total) by mouth 2 (two) times daily (Patient not taking: Reported on 09/06/2022)     sucralfate (CARAFATE) 1 gram tablet Take 1 g by mouth 4 (four) times daily (Patient not  taking: Reported on 05/03/2022)     timoloL maleate (TIMOPTIC) 0.5 % ophthalmic solution Place 1 drop into the right eye 2 (two) times daily (Patient not taking: Reported on 10/01/2021) 10 mL 11   No current facility-administered medications for this visit.    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  PAST SURGICAL HISTORY: Past Surgical History:  Procedure Laterality Date   REPAIR OF FRACTURED LT KNEE  01/11/1986   GLAUCOMA EYE SURGERY Right 03/16/2004   Ahmed x 2   VITREOUS RETINAL SURGERY Right 06/13/2006   ant and post membranectomy and PPV    LENS EYE SURGERY Right 06/16/2007   Membranectomy, IOL removal, and retisert implant OD    VITREOUS RETINAL SURGERY Right 06/16/2007   Membranectomy, IOL removal, and retisert implant OD   ANTERIOR SEGMENT EYE SURGERY Right 06/29/2007   fluocinolone implant OD   CORNEAL EYE SURGERY Right 01/21/2011   PK OD    GLAUCOMA EYE SURGERY Right 05/24/2011   revisionof aqueous shunt    TUBE REVISION W/SCLERA & AMNIOTIC MEMBRANE GRAFTING Right 09/27/2011   Procedure: TUBE REVISION W/SCLERA & AMNIOTIC MEMBRANE GRAFTING;  Surgeon: Bettye Boeck, MD;  [facility];  Service: Ophthalmology;  Laterality: Right;   DESTRUCTION CILIARY BODY BY CYCLOPHOTOCOAGULATION Right 07/19/2014   Procedure: Right eye micropulse (TDC) ;  Surgeon: Bettye Boeck, MD;  [facility];  Service: Ophthalmology;   Laterality: Right;  PAT phone call [phone]   CORNEAL TRANSPLANT PKP PSEUDOPHAKIC Right 06/26/2015   Procedure: CORNEAL TRANSPLANT PKP PSEUDOPHAKIC;  Surgeon: Timoteo Ace, MD;  [facility];  Service: Ophthalmology;  Laterality: Right;  Tissue ordered 05/21/15 MIS, ch   IMPLANTATION INTRAVITREAL DRUG DELIVERY SYSTEM Right 06/26/2015  Procedure: IMPLANTATION OF INTRAVITREAL DRUG DELIVERY SYSTEM (EG, GANCICLOVIR IMPLANT), INCLUDES CONCOMITANT REMOVAL OF VITREOUS;  Surgeon: Leandrew Koyanagi, MD;  [facility];  Service: Ophthalmology;  Laterality: Right;   CORNEAL TRANSPLANT IN APHAKIA Right 12/23/2020   Procedure: PK (Aphakia) -KERATOPLASTY, PENETRATING (IN APHAKIA) (CORNEAL TRANSPLANT);  Surgeon: Robyn Haber, Karl Luke, MD;  [facility];  Service: Ophthalmology;  Laterality: Right;   REVISION AQUEOUS SHUNT Right 12/23/2020   Procedure: REVISION OF TUBE AQUEOUS SHUNT TO EXTRAOCULAR EQUATORIAL PLATE RESERVOIR; WITH GRAFT;  Surgeon: Bettye Boeck, MD;  [facility];  Service: Ophthalmology;  Laterality: Right;   CORNEAL TRANSPLANT PKP PHAKIC Right 07/21/2021   Procedure: PK (Phakic) -   KERATOPLASTY, PENETRATING (EXCEPT IN APHAKIA OR PSEUDOPHAKIA) (CORNEAL TRANSPLANT);  Surgeon: Robyn Haber, Karl Luke, MD;  [facility];  Service: Ophthalmology;  Laterality: Right;   CORNEAL TRANSPLANT PKP PHAKIC Left 02/18/2022   Procedure: PK (Phakic) -  KERATOPLASTY, PENETRATING (EXCEPT IN APHAKIA OR PSEUDOPHAKIA) (CORNEAL TRANSPLANT);  Surgeon: Phylliss Bob, MD;  [facility];  Service: Ophthalmology;  Laterality: Left;   EXTRACTION CATARACT INTRACAPSULAR W/INSERTION INTRAOCULAR PROSTHESIS Left 02/18/2022   Procedure: EXTRACAPSULAR CATARACT ECCE REMOVAL WITH INSERTION OF INTRAOCULAR LENS PROSTHESIS (1 STAGE PROCEDURE), MANUAL OR MECHANICAL TECHNIQUE; WITHOUT ENDOSCOPIC CYCLOPHOTOCOAGULATION;  Surgeon: Phylliss Bob, MD;  [facility];  Service: Ophthalmology;  Laterality: Left;   REPAIR IRIS & CILIARY BODY Left 02/18/2022   Procedure: REPAIR OF IRIS, CILIARY BODY (AS FOR IRIDODIALYSIS);  Surgeon: Phylliss Bob, MD;  [facility];  Service: Ophthalmology;  Laterality: Left;   VITRECTOMY MECHANICAL PARS PLANA Left 02/18/2022   Procedure: VITRECTOMY; 23G, MECHANICAL, PARS PLANA APPROACH;  Surgeon: Dalbert Mayotte, MD;  [facility];  Service: Ophthalmology;  Laterality: Left;   EYE SURGERY     GLAUCOMA EYE SURGERY Right    PI    LENS EYE SURGERY Right    unknown   REMOVAL SECONDARY MEMBRANOUS CATARACT Right      FAMILY HISTORY: Family History  Problem Relation Name Age of Onset   No Known Problems Mother     High blood pressure (Hypertension) Father     No Known Problems Sister     No Known Problems Brother     Cataracts Maternal Grandmother     High blood pressure (Hypertension) Paternal Grandmother     High blood pressure (Hypertension) Paternal Grandfather     Glaucoma Neg Hx     Macular degeneration Neg Hx     Diabetes Neg Hx     Blindness Neg Hx     Anesthesia problems Neg Hx     Malignant hyperthermia Neg Hx     Strabismus Neg Hx       SOCIAL HISTORY: Social History   Socioeconomic History   Marital status: Single  Tobacco Use   Smoking status: Never   Smokeless tobacco: Never  Vaping Use   Vaping status: Never Used  Substance and Sexual Activity   Alcohol use: No    Alcohol/week: 0.0 standard drinks of alcohol   Drug use: No   Sexual activity: Yes    Partners: Female  Social History Narrative   Married with a son    PHYSICAL EXAM: Vitals:   03/17/23 1022  BP: (!) 146/85  Pulse: 87   Body mass index is 23.34 kg/m. Weight: 67.6 kg (149 lb)   GENERAL: Alert, active, oriented x3  HEENT: Pupils equal reactive  to light. Extraocular movements are intact. Sclera clear. Palpebral conjunctiva normal red color.Pharynx  clear.  NECK: Supple with no palpable mass and no adenopathy.  LUNGS: Sound clear with no rales rhonchi or wheezes.  HEART: Regular rhythm S1 and S2 without murmur.  ABDOMEN: Soft and depressible, nontender with no palpable mass, no hepatomegaly.  Large right, left moderately sized reducible inguinal hernias.  EXTREMITIES: Well-developed well-nourished symmetrical with no dependent edema.  NEUROLOGICAL: Awake alert oriented, facial expression symmetrical, moving all extremities.  REVIEW OF DATA: I have reviewed the following data today: No visits with results within 3 Month(s) from this visit.  Latest known visit with results is:  Office Visit on 10/21/2022  Component Date Value   T. Pallidum Antibody (Sy* 10/21/2022 Negative    NIL 10/21/2022 0.01    TB 1 Antigen 10/21/2022 0.02    TB 2 Antigen 10/21/2022 0.01    Mitogen 10/21/2022 10.00    Quantiferon TB 10/21/2022 Negative    Angiotensin-Converting E* 10/21/2022 37    Lysozyme, Serum 10/21/2022 13.7 (H)    Sodium 10/21/2022 138    Potassium 10/21/2022 4.3    Chloride 10/21/2022 103    Carbon Dioxide (CO2) 10/21/2022 26    Urea Nitrogen (BUN) 10/21/2022 40 (H)    Creatinine 10/21/2022 2.9 (H)    Glucose 10/21/2022 132    Calcium 10/21/2022 9.4    AST (Aspartate Aminotran* 10/21/2022 15    ALT (Alanine Aminotransf* 10/21/2022 11 (L)    Bilirubin, Total 10/21/2022 0.5    Alk Phos (Alkaline Phosp* 10/21/2022 53    Albumin 10/21/2022 3.5    Protein, Total 10/21/2022 7.4    Anion Gap 10/21/2022 9    BUN/CREA Ratio 10/21/2022 14    Glomerular Filtration Ra* 10/21/2022 25    WBC (White Blood Cell Co* 10/21/2022 12.0 (H)    Hemoglobin 10/21/2022 13.0 (L)    Hematocrit 10/21/2022 38.8 (L)    Platelets 10/21/2022 219    MCV (Mean Corpuscular Vo* 10/21/2022 93    MCH (Mean Corpuscular He* 10/21/2022 31.0    MCHC (Mean Corpuscular H* 10/21/2022 33.5    RBC (Red Blood Cell Coun* 10/21/2022 4.19 (L)    RDW-CV (Red Cell  Distrib* 10/21/2022 14.9 (H)    NRBC (Nucleated Red Bloo* 10/21/2022 0.00    NRBC % (Nucleated Red Bl* 10/21/2022 0.0    MPV (Mean Platelet Volum* 10/21/2022 12.3 (H)    Neutrophil Count 10/21/2022 9.8 (H)    Neutrophil % 10/21/2022 81.8 (H)    Lymphocyte Count 10/21/2022 1.6    Lymphocyte % 10/21/2022 13.0    Monocyte Count 10/21/2022 0.5    Monocyte % 10/21/2022 3.7    Eosinophil Count 10/21/2022 0.09    Eosinophil % 10/21/2022 0.7    Basophil Count 10/21/2022 0.05    Basophil % 10/21/2022 0.4    Immature Granulocyte Cou* 10/21/2022 0.05    Immature Granulocyte % 10/21/2022 0.4    Hepatitis B Core Antibod* 10/21/2022 NonReactive    Hepatitis B Surface Anti* 10/21/2022 NonReactive    Hepatitis B Surface Anti* 10/21/2022 Negative    Hepatitis B Surface Anti* 10/21/2022 3.60    NIL Rflx 10/21/2022 0.01    TB 1 Antigen Rflx 10/21/2022 0.02    TB 2 Antigen Rflx 10/21/2022 0.01    Mitogen Rflx 10/21/2022 10.00    QFT Rflx Instrument Trig* 10/21/2022 1.00     Results    Assessment & Plan Bilateral inguinal hernia   Bilateral inguinal hernias are present, with the right  side significantly larger and causing increased discomfort due to a notable defect in the abdominal wall.  Surgical repair is necessary as hernias do not resolve without intervention. A laparoscopic repair is planned. Schedule the procedure with three small incisions to address both hernias. Advise no lifting for at least two weeks post-surgery, potentially extending to four weeks based on recovery. Instruct on normal post-operative fluid buildup, which will resolve over time. Prescribe pain medication for post-operative management, with acetaminophen or ibuprofen for mild pain. Recommend ice packs for swelling and bruising in the groin area. Advise on normal showering post-surgery with no special wound care needed due to skin glue on incisions. Plan a follow-up visit two weeks post-surgery to assess recovery and discuss  activity level.  The patient was oriented about the treatment alternatives (observation vs surgical repair). Due to patient symptoms, repair is recommended. Patient oriented about the surgical procedure, the use of mesh and its risk of complications such as: infection, bleeding, injury to vas deference, vasculature and testicle, injury to bowel or bladder, and chronic pain.   Non-recurrent bilateral inguinal hernia without obstruction or gangrene [K40.20]   Patient verbalized understanding, all questions were answered, and were agreeable with the plan outlined above.    Carolan Shiver, MD  Electronically signed by Carolan Shiver, MD

## 2023-03-22 ENCOUNTER — Other Ambulatory Visit: Payer: Self-pay

## 2023-03-22 ENCOUNTER — Encounter
Admission: RE | Admit: 2023-03-22 | Discharge: 2023-03-22 | Disposition: A | Discharge status: Home / Self Care | Source: Ambulatory Visit | Attending: General Surgery | Admitting: General Surgery

## 2023-03-22 VITALS — Ht 67.0 in | Wt 150.0 lb

## 2023-03-22 DIAGNOSIS — I159 Secondary hypertension, unspecified: Secondary | ICD-10-CM

## 2023-03-22 DIAGNOSIS — I10 Essential (primary) hypertension: Secondary | ICD-10-CM

## 2023-03-22 DIAGNOSIS — I7143 Infrarenal abdominal aortic aneurysm, without rupture: Secondary | ICD-10-CM

## 2023-03-22 DIAGNOSIS — I1A0 Resistant hypertension: Secondary | ICD-10-CM

## 2023-03-22 HISTORY — DX: Bilateral inguinal hernia, without obstruction or gangrene, not specified as recurrent: K40.20

## 2023-03-22 NOTE — Patient Instructions (Addendum)
 Your procedure is scheduled on: Monday 03/28/23  Report to the Registration Desk on the 1st floor of the [facility]. To find out your arrival time, please call [phone] between 1PM - 3PM on: Friday 03/25/23  If your arrival time is 6:00 am, do not arrive before that time as the [facility] entrance doors do not open until 6:00 am.  REMEMBER: Instructions that are not followed completely may result in serious medical risk, up to and including death; or upon the discretion of your surgeon and anesthesiologist your surgery may need to be rescheduled.  Do not eat food or drink fluids after midnight the night before surgery.  No gum chewing or hard candies.   One week prior to surgery: Stop Anti-inflammatories (NSAIDS) such as Advil, Aleve, Ibuprofen, Motrin, Naproxen, Naprosyn and Aspirin based products such as Excedrin, Goody's Powder, BC Powder. Stop ANY OVER THE COUNTER supplements until after surgery. Ascorbic Acid (VITAMIN C  ELDERBERRY PO  cholecalciferol (VITAMIN D3) 25 MCG (1000 UNIT)     You may however, continue to take Tylenol if needed for pain up until the day of surgery.  Stop dapagliflozin propanediol (FARXIGA) 5 MG 3 days prior to surgery (Take last dose 03/24/23)  Continue taking all of your other prescription medications up until the day of surgery.  ON THE DAY OF SURGERY ONLY TAKE THESE MEDICATIONS WITH SIPS OF WATER:  carvedilol (COREG) 25 MG  minoxidil (LONITEN) 2.5 MG  pantoprazole (PROTONIX) 40 MG    No Alcohol for 24 hours before or after surgery.  No Smoking including e-cigarettes for 24 hours before surgery.  No chewable tobacco products for at least 6 hours before surgery.  No nicotine patches on the day of surgery.  Do not use any "recreational" drugs for at least a week (preferably 2 weeks) before your surgery.  Please be advised that the combination of cocaine and anesthesia may have negative outcomes, up to and including death. If you test  positive for cocaine, your surgery will be cancelled.  On the morning of surgery brush your teeth with toothpaste and water, you may rinse your mouth with mouthwash if you wish. Do not swallow any toothpaste or mouthwash.  Use CHG Soap as directed on instruction sheet.  Do not wear jewelry, make-up, hairpins, clips or nail polish.  For welded (permanent) jewelry: bracelets, anklets, waist bands, etc.  Please have this removed prior to surgery.  If it is not removed, there is a chance that hospital personnel will need to cut it off on the day of surgery.  Do not wear lotions, powders, or perfumes.   Do not shave body hair from the neck down 48 hours before surgery.  Contact lenses, hearing aids and dentures may not be worn into surgery.  Do not bring valuables to the hospital. [facility] is not responsible for any missing/lost belongings or valuables.   Notify your doctor if there is any change in your medical condition (cold, fever, infection).  Wear comfortable clothing (specific to your surgery type) to the hospital.  After surgery, you can help prevent lung complications by doing breathing exercises.  Take deep breaths and cough every 1-2 hours. Your doctor may order a device called an Incentive Spirometer to help you take deep breaths. When coughing or sneezing, hold a pillow firmly against your incision with both hands. This is called "splinting." Doing this helps protect your incision. It also decreases belly discomfort.  If you are being admitted to the hospital overnight,  leave your suitcase in the car. After surgery it may be brought to your room.  In case of increased patient census, it may be necessary for you, the patient, to continue your postoperative care in the Same Day Surgery department.  If you are being discharged the day of surgery, you will not be allowed to drive home. You will need a responsible individual to drive you home and stay with you for 24 hours after  surgery.   If you are taking public transportation, you will need to have a responsible individual with you.  Please call the Pre-admissions Testing Dept. at [phone] if you have any questions about these instructions.  Surgery Visitation Policy:  Patients having surgery or a procedure may have two visitors.  Children under the age of 41 must have an adult with them who is not the patient.  Temporary Visitor Restrictions Due to increasing cases of flu, RSV and COVID-19: Children ages 42 and under will not be able to visit patients in [facility] hospitals under most circumstances.  Inpatient Visitation:    Visiting hours are 7 a.m. to 8 p.m. Up to four visitors are allowed at one time in a patient room. The visitors may rotate out with other people during the day.  One visitor age 37 or older may stay with the patient overnight and must be in the room by 8 p.m.    Preparing for Surgery with CHLORHEXIDINE GLUCONATE (CHG) Soap  Chlorhexidine Gluconate (CHG) Soap  o An antiseptic cleaner that kills germs and bonds with the skin to continue killing germs even after washing  o Used for showering the night before surgery and morning of surgery  Before surgery, you can play an important role by reducing the number of germs on your skin.  CHG (Chlorhexidine gluconate) soap is an antiseptic cleanser which kills germs and bonds with the skin to continue killing germs even after washing.  Please do not use if you have an allergy to CHG or antibacterial soaps. If your skin becomes reddened/irritated stop using the CHG.  1. Shower the NIGHT BEFORE SURGERY and the MORNING OF SURGERY with CHG soap.  2. If you choose to wash your hair, wash your hair first as usual with your normal shampoo.  3. After shampooing, rinse your hair and body thoroughly to remove the shampoo.  4. Use CHG as you would any other liquid soap. You can apply CHG directly to the skin and wash gently with a  scrungie or a clean washcloth.  5. Apply the CHG soap to your body only from the neck down. Do not use on open wounds or open sores. Avoid contact with your eyes, ears, mouth, and genitals (private parts). Wash face and genitals (private parts) with your normal soap.  6. Wash thoroughly, paying special attention to the area where your surgery will be performed.  7. Thoroughly rinse your body with warm water.  8. Do not shower/wash with your normal soap after using and rinsing off the CHG soap.  9. Pat yourself dry with a clean towel.  10. Wear clean pajamas to bed the night before surgery.  12. Place clean sheets on your bed the night of your first shower and do not sleep with pets.  13. Shower again with the CHG soap on the day of surgery prior to arriving at the hospital.  14. Do not apply any deodorants/lotions/powders.  15. Please wear clean clothes to the hospital.

## 2023-03-24 ENCOUNTER — Encounter
Admission: RE | Admit: 2023-03-24 | Discharge: 2023-03-24 | Disposition: A | Discharge status: Home / Self Care | Source: Ambulatory Visit | Attending: General Surgery | Admitting: General Surgery

## 2023-03-24 DIAGNOSIS — I1A0 Resistant hypertension: Secondary | ICD-10-CM | POA: Diagnosis not present

## 2023-03-24 DIAGNOSIS — I10 Essential (primary) hypertension: Secondary | ICD-10-CM | POA: Diagnosis not present

## 2023-03-24 DIAGNOSIS — I159 Secondary hypertension, unspecified: Secondary | ICD-10-CM | POA: Diagnosis not present

## 2023-03-24 DIAGNOSIS — I7143 Infrarenal abdominal aortic aneurysm, without rupture: Secondary | ICD-10-CM | POA: Insufficient documentation

## 2023-03-24 DIAGNOSIS — Z01810 Encounter for preprocedural cardiovascular examination: Secondary | ICD-10-CM | POA: Diagnosis not present

## 2023-03-24 DIAGNOSIS — R9431 Abnormal electrocardiogram [ECG] [EKG]: Secondary | ICD-10-CM | POA: Diagnosis not present

## 2023-03-25 ENCOUNTER — Ambulatory Visit (INDEPENDENT_AMBULATORY_CARE_PROVIDER_SITE_OTHER): Payer: 59 | Admitting: Vascular Surgery

## 2023-03-25 ENCOUNTER — Encounter (INDEPENDENT_AMBULATORY_CARE_PROVIDER_SITE_OTHER): Payer: Self-pay | Admitting: Vascular Surgery

## 2023-03-25 VITALS — BP 171/101 | HR 93 | Resp 16 | Ht 67.0 in | Wt 138.6 lb

## 2023-03-25 DIAGNOSIS — I7143 Infrarenal abdominal aortic aneurysm, without rupture: Secondary | ICD-10-CM | POA: Diagnosis not present

## 2023-03-25 DIAGNOSIS — E782 Mixed hyperlipidemia: Secondary | ICD-10-CM

## 2023-03-25 DIAGNOSIS — I10 Essential (primary) hypertension: Secondary | ICD-10-CM | POA: Diagnosis not present

## 2023-03-25 NOTE — Assessment & Plan Note (Signed)
 lipid control important in reducing the progression of atherosclerotic disease. Continue statin therapy

## 2023-03-25 NOTE — Assessment & Plan Note (Signed)
blood pressure control important in reducing the progression of atherosclerotic disease and aneurysmal growth. On appropriate oral medications.  

## 2023-03-25 NOTE — Progress Notes (Signed)
 Patient ID: Christopher Mcdaniel., male   DOB: December 23, 1970, 53 y.o.   MRN: [id number]  Chief Complaint  Patient presents with   New Patient (Initial Visit)    Ref Evalee Mutton consult AAA    HPI Muad Noga. is a 53 y.o. male.  I am asked to see the patient by Dr. Ellsworth Lennox for evaluation of an abdominal aortic aneurysm. He has poorly controlled hypertension and this is being managed and improved, but still higher than ideal. He has no aneurysm related symptoms. Specifically, the patient denies new back or abdominal pain, or signs of peripheral embolization.   His AAA was 3.7 cm by duplex about 2 months ago.    Past Medical History:  Diagnosis Date   Aphakia, right    Arthritis    knees   Cataract cortical, senile, left    Chronic kidney disease    CME (cystoid macular edema)    Complication of anesthesia    wakes up during surgery   Failed corneal transplant    GERD (gastroesophageal reflux disease)    History of hiatal hernia    Hyperaldosteronism (HCC)    Hypertension    Hypotony of eye    Macular degeneration    Mature cataract    Non-recurrent bilateral inguinal hernia without obstruction or gangrene    Panuveitis    Panuveitis    POAG (primary open-angle glaucoma)    Uveitic glaucoma     Past Surgical History:  Procedure Laterality Date   ADRENAL GLAND SURGERY Bilateral 2016   biopsy   CORNEAL TRANSPLANT     EYE SURGERY  2017   fracture repair of left knee     FRACTURE SURGERY     FRACTURED LEFT KNEE   glaucoma eye surgery     lens eye surgery       Family History  Problem Relation Age of Onset   Allergies Mother    Hypertension Mother    Healthy Father    Cancer Neg Hx       Social History   Tobacco Use   Smoking status: Never   Smokeless tobacco: Never  Vaping Use   Vaping status: Never Used  Substance Use Topics   Alcohol use: No   Drug use: No     Allergies  Allergen Reactions   Ace Inhibitors Swelling    Lip swelling     Quinapril Swelling    angioedema    Current Outpatient Medications  Medication Sig Dispense Refill   acetaminophen (TYLENOL) 500 MG tablet Take 500 mg by mouth every 6 (six) hours as needed.     allopurinol (ZYLOPRIM) 100 MG tablet Take 100 mg by mouth daily.     Ascorbic Acid (VITAMIN C GUMMIE PO) Take 2 tablets by mouth daily.     atorvastatin (LIPITOR) 20 MG tablet Take 1 tablet (20 mg total) by mouth daily. 90 tablet 1   carvedilol (COREG) 25 MG tablet Take 2 tablets (50 mg total) by mouth 2 (two) times daily. 360 tablet 1   cetirizine (ZYRTEC) 10 MG tablet Take 10 mg by mouth daily.     chlorthalidone (HYGROTON) 50 MG tablet TAKE 1 TABLET BY MOUTH EVERY DAY IN THE MORNING 90 tablet 1   cholecalciferol (VITAMIN D3) 25 MCG (1000 UNIT) tablet Take 1,000 Units by mouth daily.     cloNIDine (CATAPRES - DOSED IN MG/24 HR) 0.3 mg/24hr patch Place 1 patch (0.3 mg total) onto the skin once a week.  8 patch 2   dapagliflozin propanediol (FARXIGA) 5 MG TABS tablet Take 5 mg by mouth daily.     doxazosin (CARDURA) 2 MG tablet Take 2 mg by mouth daily.     ELDERBERRY PO Take 2 tablets by mouth daily.     minoxidil (LONITEN) 2.5 MG tablet Take 3 tablets (7.5 mg total) by mouth 2 (two) times daily. 180 tablet 2   ondansetron (ZOFRAN) 4 MG tablet Take 4 mg by mouth every 8 (eight) hours as needed for nausea or vomiting.     pantoprazole (PROTONIX) 40 MG tablet Take 40 mg by mouth 2 (two) times daily.     prednisoLONE acetate (PRED FORTE) 1 % ophthalmic suspension Place 1 drop into both eyes every 4 (four) hours.     spironolactone (ALDACTONE) 100 MG tablet Take 1 tablet (100 mg total) by mouth 2 (two) times daily. 60 tablet 2   NIFEdipine (ADALAT CC) 90 MG 24 hr tablet Take 90 mg by mouth daily.  (Patient not taking: Reported on 05/11/2022)     potassium chloride SA (KLOR-CON M20) 20 MEQ tablet Take 1 tablet (20 mEq total) by mouth daily. (Patient not taking: Reported on 05/11/2022) 7 tablet 0    predniSONE (DELTASONE) 10 MG tablet Take 10 mg by mouth daily with breakfast. (Patient not taking: Reported on 03/25/2023)     sucralfate (CARAFATE) 1 g tablet Take 1 g by mouth as needed. (Patient not taking: Reported on 03/22/2023)     No current facility-administered medications for this visit.      REVIEW OF SYSTEMS (Negative unless checked)  Constitutional: [] Weight loss  [] Fever  [] Chills Cardiac: [] Chest pain   [] Chest pressure   [] Palpitations   [] Shortness of breath when laying flat   [] Shortness of breath at rest   [] Shortness of breath with exertion. Vascular:  [] Pain in legs with walking   [] Pain in legs at rest   [] Pain in legs when laying flat   [] Claudication   [] Pain in feet when walking  [] Pain in feet at rest  [] Pain in feet when laying flat   [] History of DVT   [] Phlebitis   [] Swelling in legs   [] Varicose veins   [] Non-healing ulcers Pulmonary:   [] Uses home oxygen   [] Productive cough   [] Hemoptysis   [] Wheeze  [] COPD   [] Asthma Neurologic:  [] Dizziness  [] Blackouts   [] Seizures   [] History of stroke   [] History of TIA  [] Aphasia   [] Temporary blindness   [] Dysphagia   [] Weakness or numbness in arms   [] Weakness or numbness in legs Musculoskeletal:  [x] Arthritis   [] Joint swelling   [] Joint pain   [] Low back pain Hematologic:  [] Easy bruising  [] Easy bleeding   [] Hypercoagulable state   [] Anemic  [] Hepatitis Gastrointestinal:  [] Blood in stool   [] Vomiting blood  [x] Gastroesophageal reflux/heartburn   [] Abdominal pain Genitourinary:  [] Chronic kidney disease   [] Difficult urination  [] Frequent urination  [] Burning with urination   [] Hematuria Skin:  [] Rashes   [] Ulcers   [] Wounds Psychological:  [] History of anxiety   []  History of major depression.    Physical Exam BP (!) 171/101   Pulse 93   Resp 16   Ht 5\' 7"  (1.702 m)   Wt 138 lb 9.6 oz (62.9 kg)   BMI 21.71 kg/m  Gen:  WD/WN, NAD Head: [place]/AT, No temporalis wasting.  Ear/Nose/Throat: Hearing grossly intact,  nares w/o erythema or drainage, oropharynx w/o Erythema/Exudate Eyes: visual acuity diminished with cloudy appearance Neck: trachea  midline.  No JVD.  Pulmonary:  Good air movement, respirations not labored, no use of accessory muscles  Cardiac: RRR, no JVD Vascular:  Vessel Right Left  Radial Palpable Palpable                                   Gastrointestinal:. No masses, surgical incisions, or scars. Musculoskeletal: M/S 5/5 throughout.  Extremities without ischemic changes.  No deformity or atrophy. No edema. Neurologic: Sensation grossly intact in extremities.  Symmetrical.  Speech is fluent. Motor exam as listed above. Psychiatric: Judgment intact, Mood & affect appropriate for pt's clinical situation. Dermatologic: No rashes or ulcers noted.  No cellulitis or open wounds.    Radiology No results found.  Labs Recent Results (from the past 2160 hours)  Lipid panel     Status: Abnormal   Collection Time: 01/18/23 10:28 AM  Result Value Ref Range   Cholesterol, Total 156 100 - 199 mg/dL   Triglycerides 161 (H) 0 - 149 mg/dL   HDL 41 >09 mg/dL   VLDL Cholesterol Cal 27 5 - 40 mg/dL   LDL Chol Calc (NIH) 88 0 - 99 mg/dL   Chol/HDL Ratio 3.8 0.0 - 5.0 ratio    Comment:                                   T. Chol/HDL Ratio                                             Men  Women                               1/2 Avg.Risk  3.4    3.3                                   Avg.Risk  5.0    4.4                                2X Avg.Risk  9.6    7.1                                3X Avg.Risk 23.4   11.0   Comprehensive metabolic panel     Status: Abnormal   Collection Time: 01/18/23 10:28 AM  Result Value Ref Range   Glucose 106 (H) 70 - 99 mg/dL   BUN 43 (H) 6 - 24 mg/dL   Creatinine, Ser 6.04 (H) 0.76 - 1.27 mg/dL   eGFR 19 (L) >54 UJ/WJX/9.14   BUN/Creatinine Ratio 12 9 - 20   Sodium 141 134 - 144 mmol/L   Potassium 5.2 3.5 - 5.2 mmol/L   Chloride 104 96 - 106 mmol/L    CO2 25 20 - 29 mmol/L   Calcium 9.2 8.7 - 10.2 mg/dL   Total Protein 6.5 6.0 - 8.5 g/dL   Albumin 3.9 3.8 - 4.9 g/dL   Globulin, Total 2.6 1.5 - 4.5 g/dL   Bilirubin Total  0.2 0.0 - 1.2 mg/dL   Alkaline Phosphatase 57 44 - 121 IU/L   AST 12 0 - 40 IU/L   ALT 5 0 - 44 IU/L  CBC With Diff/Platelet     Status: Abnormal   Collection Time: 01/18/23 10:28 AM  Result Value Ref Range   WBC 10.3 3.4 - 10.8 x10E3/uL   RBC 3.90 (L) 4.14 - 5.80 x10E6/uL   Hemoglobin 12.0 (L) 13.0 - 17.7 g/dL   Hematocrit 14.7 (L) 82.9 - 51.0 %   MCV 94 79 - 97 fL   MCH 30.8 26.6 - 33.0 pg   MCHC 32.7 31.5 - 35.7 g/dL   RDW 56.2 13.0 - 86.5 %   Platelets 258 150 - 450 x10E3/uL   Neutrophils 69 Not Estab. %   Lymphs 21 Not Estab. %   Monocytes 7 Not Estab. %   Eos 2 Not Estab. %   Basos 1 Not Estab. %   Neutrophils Absolute 7.2 (H) 1.4 - 7.0 x10E3/uL   Lymphocytes Absolute 2.1 0.7 - 3.1 x10E3/uL   Monocytes Absolute 0.7 0.1 - 0.9 x10E3/uL   EOS (ABSOLUTE) 0.3 0.0 - 0.4 x10E3/uL   Basophils Absolute 0.1 0.0 - 0.2 x10E3/uL   Immature Granulocytes 0 Not Estab. %   Immature Grans (Abs) 0.0 0.0 - 0.1 x10E3/uL  Hepatic function panel     Status: None   Collection Time: 01/18/23 10:28 AM  Result Value Ref Range   Bilirubin, Direct 0.10 0.00 - 0.40 mg/dL    Assessment/Plan:  Essential hypertension blood pressure control important in reducing the progression of atherosclerotic disease and aneurysmal growth. On appropriate oral medications.   Hyperlipidemia lipid control important in reducing the progression of atherosclerotic disease. Continue statin therapy   AAA (abdominal aortic aneurysm) without rupture (HCC) Aortic duplex about 2 months ago demonstrated a 3.7 cm abdominal aortic aneurysm.  His hypertension is the main driving force and we discussed how important it was to get his blood pressure under control to avoid aneurysmal growth in a young man.  I want to see him back in 3 to 4 months with  duplex to follow his aneurysm.  We discussed the natural history and pathophysiology of aneurysmal disease.  All questions were answered.      Festus Barren 03/25/2023, 12:56 PM   This note was created with Dragon medical transcription system.  Any errors from dictation are unintentional.

## 2023-03-25 NOTE — Assessment & Plan Note (Signed)
 Aortic duplex about 2 months ago demonstrated a 3.7 cm abdominal aortic aneurysm.  His hypertension is the main driving force and we discussed how important it was to get his blood pressure under control to avoid aneurysmal growth in a young man.  I want to see him back in 3 to 4 months with duplex to follow his aneurysm.  We discussed the natural history and pathophysiology of aneurysmal disease.  All questions were answered.

## 2023-03-25 NOTE — Patient Instructions (Signed)
Abdominal Aortic Aneurysm  An aneurysm is a bulge in an artery. It happens when blood pushes against a weak or damaged artery wall. An abdominal aortic aneurysm (AAA) is an aneurysm in the lower part of the aorta. The aorta is the main artery of the body. It supplies blood from the heart to the rest of the body. Most aneurysms do not cause symptoms. Some can cause problems. An AAA can cause two serious problems: It can grow and then burst (rupture). It can cause blood to flow between the layers of the wall of the aorta through a tear (aortic dissection). These problems are medical emergencies. They can cause bleeding inside the body. They should be treated right away. What are the causes? The exact cause of this condition is not known. What increases the risk? You may be more likely to develop an AAA if: You are male and 31 years of age or older. You are Caucasian. You use or have used nicotine or tobacco products. You have a family history of aneurysms. You have an injury or trauma to your aorta. You are obese. You have: Arteriosclerosis. This is when your arteries harden. Arteritis. This is inflammation of the walls of an artery. Certain genetic conditions. Infectious aortitis. This is an infection in the wall of your aorta caused by bacteria. High cholesterol. High blood pressure (hypertension). What are the signs or symptoms? Symptoms depend on how big the aneurysm is and how fast it is growing. Most grow slowly and do not cause symptoms. In some cases, you may have: Severe pain in your abdomen, side, or lower back. A feeling of fullness after you eat only a little bit of food. A throbbing lump in your abdomen. Painful feet or toes. You may also have discolored skin or sores on your feet or toes. Constipation or trouble peeing (urinating). If your AAA bursts, you may: Feel sudden, severe pain in your abdomen, side, or back. Have nausea or vomiting. Feel light-headed or  faint. How is this diagnosed? This condition may be diagnosed with a physical exam to check for throbbing and listen to blood flow in your abdomen. You may also have tests, such as: Ultrasound. X-rays. CT scan. MRI. Angiogram. This test checks your arteries for damage or blockage. Because most AAAs that have not burst do not cause symptoms, they are often found during exams for other conditions. How is this treated? Treatment depends on: The size of your aneurysm. How fast your aneurysm is growing. Your age. Risk factors for a burst AAA. If your aneurysm is smaller than 2 inches (5 cm), your health care provider may: Keep an eye on it to see if it gets bigger. You may need to have an ultrasound every 6-12 months, every year, or every few years. Give you medicines to control blood pressure, treat pain, or fight infection. If your aneurysm is larger than 2 inches (5 cm), you may need surgery. Follow these instructions at home: Eating and drinking  Eat a heart-healthy diet. This includes lots of fresh fruits and vegetables, whole grains, low-fat (lean) protein, and low-fat dairy products. Avoid foods that are high in saturated fat and cholesterol. These include red meat and some dairy products. Lifestyle  Do not use any products that contain nicotine or tobacco. These products include cigarettes, chewing tobacco, and vaping devices, such as e-cigarettes. If you need help quitting, ask your provider. Check your blood pressure often. Follow instructions on how to keep it within normal limits. Have your  cholesterol levels checked often. Follow instructions on how to keep levels within normal limits. Stay active. Exercise on a regular basis. Talk with your provider about how often to exercise and which types of exercise are safe for you. Maintain a healthy weight. Alcohol use Do not drink alcohol if: Your provider tells you not to drink. You are pregnant, may be pregnant, or plan to  become pregnant. If you drink alcohol: Limit how much you have to: 0-1 drink a day if you are male. 0-2 drinks a day if you are male. Know how much alcohol is in your drink. In the [place] one drink is one 12 oz bottle of beer (355 mL), one 5 oz glass of wine (148 mL), or one 1 oz glass of hard liquor (44 mL). General instructions Take over-the-counter and prescription medicines only as told by your provider. You may have to avoid lifting. Ask your provider how much you can safely lift. If you can, learn your family's health history. Keep all follow-up visits. Your provider will need to watch the size of your aneurysm and how fast it is growing. Where to find more information American Heart Association: heart.org Contact a health care provider if: You have pain in your abdomen, side, or back. You have a throbbing mass in your abdomen. Your heart beats fast when you stand. You have nausea or vomiting. You are constipated or have trouble peeing. You have a fever. Get help right away if: You have sudden, severe pain in your abdomen, side, or back. You feel light-headed, or you faint. You have sweaty, clammy skin. You are short of breath. These symptoms may be an emergency. Get help right away. Call 911. Do not wait to see if the symptoms will go away. Do not drive yourself to the hospital. This information is not intended to replace advice given to you by your health care provider. Make sure you discuss any questions you have with your health care provider. Document Revised: 08/04/2021 Document Reviewed: 08/04/2021 Elsevier Patient Education  2024 [employer].

## 2023-03-27 MED ORDER — CHLORHEXIDINE GLUCONATE 0.12 % MT SOLN
15.0000 mL | Freq: Once | OROMUCOSAL | Status: AC
  Administered 2023-03-28: 15 mL via OROMUCOSAL

## 2023-03-27 MED ORDER — SODIUM CHLORIDE 0.9 % IV SOLN: INTRAVENOUS | Status: DC

## 2023-03-27 MED ORDER — ORAL CARE MOUTH RINSE: 15.0000 mL | Freq: Once | OROMUCOSAL | Status: AC

## 2023-03-27 MED ORDER — CEFAZOLIN SODIUM-DEXTROSE 2-4 GM/100ML-% IV SOLN
2.0000 g | INTRAVENOUS | Status: AC
  Administered 2023-03-28: 2 g via INTRAVENOUS

## 2023-03-28 ENCOUNTER — Encounter: Payer: Self-pay | Admitting: General Surgery

## 2023-03-28 ENCOUNTER — Other Ambulatory Visit: Payer: Self-pay

## 2023-03-28 ENCOUNTER — Ambulatory Visit
Admission: RE | Admit: 2023-03-28 | Discharge: 2023-03-28 | Disposition: A | Discharge status: Home / Self Care | Attending: General Surgery | Admitting: General Surgery

## 2023-03-28 ENCOUNTER — Ambulatory Visit: Payer: Self-pay | Admitting: Anesthesiology

## 2023-03-28 ENCOUNTER — Encounter
Admission: RE | Disposition: A | Discharge status: Home / Self Care | Payer: Self-pay | Source: Home / Self Care | Attending: General Surgery

## 2023-03-28 DIAGNOSIS — Z7952 Long term (current) use of systemic steroids: Secondary | ICD-10-CM | POA: Insufficient documentation

## 2023-03-28 DIAGNOSIS — Z79621 Long term (current) use of calcineurin inhibitor: Secondary | ICD-10-CM | POA: Diagnosis not present

## 2023-03-28 DIAGNOSIS — I129 Hypertensive chronic kidney disease with stage 1 through stage 4 chronic kidney disease, or unspecified chronic kidney disease: Secondary | ICD-10-CM | POA: Diagnosis not present

## 2023-03-28 DIAGNOSIS — N1832 Chronic kidney disease, stage 3b: Secondary | ICD-10-CM | POA: Diagnosis not present

## 2023-03-28 DIAGNOSIS — Z79899 Other long term (current) drug therapy: Secondary | ICD-10-CM | POA: Diagnosis not present

## 2023-03-28 DIAGNOSIS — Z7984 Long term (current) use of oral hypoglycemic drugs: Secondary | ICD-10-CM | POA: Diagnosis not present

## 2023-03-28 DIAGNOSIS — D176 Benign lipomatous neoplasm of spermatic cord: Secondary | ICD-10-CM | POA: Insufficient documentation

## 2023-03-28 DIAGNOSIS — H401190 Primary open-angle glaucoma, unspecified eye, stage unspecified: Secondary | ICD-10-CM | POA: Diagnosis not present

## 2023-03-28 DIAGNOSIS — K4020 Bilateral inguinal hernia, without obstruction or gangrene, not specified as recurrent: Secondary | ICD-10-CM | POA: Insufficient documentation

## 2023-03-28 HISTORY — PX: INSERTION OF MESH: SHX5868

## 2023-03-28 HISTORY — PX: REPAIR, HERNIA, INGUINAL, BILATERAL, ROBOT-ASSISTED: SHX7636

## 2023-03-28 SURGERY — REPAIR, HERNIA, INGUINAL, BILATERAL, ROBOT-ASSISTED
Anesthesia: General | Site: Inguinal | Laterality: Bilateral

## 2023-03-28 MED ORDER — PHENYLEPHRINE 80 MCG/ML (10ML) SYRINGE FOR IV PUSH (FOR BLOOD PRESSURE SUPPORT)
PREFILLED_SYRINGE | INTRAVENOUS | Status: DC | PRN
  Administered 2023-03-28: 160 ug via INTRAVENOUS
  Administered 2023-03-28: 80 ug via INTRAVENOUS

## 2023-03-28 MED ORDER — FENTANYL CITRATE (PF) 100 MCG/2ML IJ SOLN
25.0000 ug | INTRAMUSCULAR | Status: DC | PRN
  Administered 2023-03-28 (×2): 25 ug via INTRAVENOUS

## 2023-03-28 MED ORDER — LIDOCAINE HCL (CARDIAC) PF 100 MG/5ML IV SOSY
PREFILLED_SYRINGE | INTRAVENOUS | Status: DC | PRN
  Administered 2023-03-28: 80 mg via INTRAVENOUS

## 2023-03-28 MED ORDER — FENTANYL CITRATE (PF) 100 MCG/2ML IJ SOLN
INTRAMUSCULAR | Status: DC | PRN
  Administered 2023-03-28 (×2): 50 ug via INTRAVENOUS

## 2023-03-28 MED ORDER — SUGAMMADEX SODIUM 200 MG/2ML IV SOLN
INTRAVENOUS | Status: DC | PRN
  Administered 2023-03-28: 50 mg via INTRAVENOUS
  Administered 2023-03-28: 200 mg via INTRAVENOUS

## 2023-03-28 MED ORDER — BUPIVACAINE-EPINEPHRINE 0.25% -1:200000 IJ SOLN
INTRAMUSCULAR | Status: DC | PRN
  Administered 2023-03-28 (×2): 15 mL

## 2023-03-28 MED ORDER — CEFAZOLIN SODIUM-DEXTROSE 2-4 GM/100ML-% IV SOLN
INTRAVENOUS | Status: AC
  Filled 2023-03-28: qty 100

## 2023-03-28 MED ORDER — ONDANSETRON HCL 4 MG/2ML IJ SOLN
INTRAMUSCULAR | Status: DC | PRN
  Administered 2023-03-28: 4 mg via INTRAVENOUS

## 2023-03-28 MED ORDER — DEXAMETHASONE SODIUM PHOSPHATE 10 MG/ML IJ SOLN
INTRAMUSCULAR | Status: DC | PRN
  Administered 2023-03-28: 4 mg via INTRAVENOUS

## 2023-03-28 MED ORDER — OXYCODONE HCL 5 MG PO TABS
5.0000 mg | ORAL_TABLET | Freq: Once | ORAL | Status: AC | PRN
  Administered 2023-03-28: 5 mg via ORAL

## 2023-03-28 MED ORDER — DROPERIDOL 2.5 MG/ML IJ SOLN: 0.6250 mg | Freq: Once | INTRAMUSCULAR | Status: DC | PRN

## 2023-03-28 MED ORDER — ACETAMINOPHEN 10 MG/ML IV SOLN
INTRAVENOUS | Status: DC | PRN
  Administered 2023-03-28: 1000 mg via INTRAVENOUS

## 2023-03-28 MED ORDER — PROPOFOL 1000 MG/100ML IV EMUL
INTRAVENOUS | Status: AC
  Filled 2023-03-28: qty 100

## 2023-03-28 MED ORDER — EPHEDRINE SULFATE-NACL 50-0.9 MG/10ML-% IV SOSY
PREFILLED_SYRINGE | INTRAVENOUS | Status: DC | PRN
  Administered 2023-03-28 (×2): 5 mg via INTRAVENOUS

## 2023-03-28 MED ORDER — PROPOFOL 10 MG/ML IV BOLUS
INTRAVENOUS | Status: AC
  Filled 2023-03-28: qty 20

## 2023-03-28 MED ORDER — LIDOCAINE HCL (PF) 2 % IJ SOLN
INTRAMUSCULAR | Status: AC
  Filled 2023-03-28: qty 5

## 2023-03-28 MED ORDER — BUPIVACAINE-EPINEPHRINE (PF) 0.25% -1:200000 IJ SOLN
INTRAMUSCULAR | Status: AC
  Filled 2023-03-28: qty 30

## 2023-03-28 MED ORDER — PROPOFOL 10 MG/ML IV BOLUS
INTRAVENOUS | Status: DC | PRN
  Administered 2023-03-28: 150 mg via INTRAVENOUS
  Administered 2023-03-28: 50 ug/kg/min via INTRAVENOUS

## 2023-03-28 MED ORDER — SUGAMMADEX SODIUM 200 MG/2ML IV SOLN
INTRAVENOUS | Status: AC
  Filled 2023-03-28: qty 2

## 2023-03-28 MED ORDER — OXYCODONE HCL 5 MG/5ML PO SOLN: 5.0000 mg | Freq: Once | ORAL | Status: AC | PRN

## 2023-03-28 MED ORDER — MIDAZOLAM HCL 2 MG/2ML IJ SOLN
INTRAMUSCULAR | Status: AC
  Filled 2023-03-28: qty 2

## 2023-03-28 MED ORDER — FENTANYL CITRATE (PF) 100 MCG/2ML IJ SOLN
INTRAMUSCULAR | Status: AC
  Filled 2023-03-28: qty 2

## 2023-03-28 MED ORDER — CHLORHEXIDINE GLUCONATE 0.12 % MT SOLN
OROMUCOSAL | Status: AC
  Filled 2023-03-28: qty 15

## 2023-03-28 MED ORDER — FENTANYL CITRATE (PF) 100 MCG/2ML IJ SOLN
INTRAMUSCULAR | Status: AC
Start: 2023-03-28 — End: ?
  Filled 2023-03-28: qty 2

## 2023-03-28 MED ORDER — OXYCODONE HCL 5 MG PO TABS
ORAL_TABLET | ORAL | Status: AC
  Filled 2023-03-28: qty 1

## 2023-03-28 MED ORDER — ROCURONIUM BROMIDE 100 MG/10ML IV SOLN
INTRAVENOUS | Status: DC | PRN
  Administered 2023-03-28: 50 mg via INTRAVENOUS
  Administered 2023-03-28: 10 mg via INTRAVENOUS
  Administered 2023-03-28: 20 mg via INTRAVENOUS

## 2023-03-28 MED ORDER — MIDAZOLAM HCL 2 MG/2ML IJ SOLN
INTRAMUSCULAR | Status: DC | PRN
  Administered 2023-03-28: 2 mg via INTRAVENOUS

## 2023-03-28 MED ORDER — ACETAMINOPHEN 10 MG/ML IV SOLN: 1000.0000 mg | Freq: Once | INTRAVENOUS | Status: DC | PRN

## 2023-03-28 MED ORDER — ACETAMINOPHEN 10 MG/ML IV SOLN
INTRAVENOUS | Status: AC
Start: 2023-03-28 — End: ?
  Filled 2023-03-28: qty 100

## 2023-03-28 MED ORDER — HYDROCODONE-ACETAMINOPHEN 5-325 MG PO TABS: 1.0000 | ORAL_TABLET | Freq: Four times a day (QID) | ORAL | 0 refills | Status: AC | PRN

## 2023-03-28 SURGICAL SUPPLY — 43 items
BAG PRESSURE INF REUSE 1000 (BAG) IMPLANT
COVER TIP SHEARS 8 DVNC (MISCELLANEOUS) ×2 IMPLANT
COVER WAND RF STERILE (DRAPES) ×2 IMPLANT
DERMABOND ADVANCED .7 DNX12 (GAUZE/BANDAGES/DRESSINGS) ×2 IMPLANT
DRAPE ARM DVNC X/XI (DISPOSABLE) ×6 IMPLANT
DRAPE COLUMN DVNC XI (DISPOSABLE) ×2 IMPLANT
ELECT REM PT RETURN 9FT ADLT (ELECTROSURGICAL) ×2 IMPLANT
ELECTRODE REM PT RTRN 9FT ADLT (ELECTROSURGICAL) ×1 IMPLANT
FORCEPS BPLR FENES DVNC XI (FORCEP) ×2 IMPLANT
GLOVE BIO SURGEON STRL SZ 6.5 (GLOVE) ×6 IMPLANT
GLOVE BIOGEL PI IND STRL 6.5 (GLOVE) ×6 IMPLANT
GOWN STRL REUS W/ TWL LRG LVL3 (GOWN DISPOSABLE) ×7 IMPLANT
IRRIGATOR SUCT 8 DISP DVNC XI (IRRIGATION / IRRIGATOR) IMPLANT
IV CATH ANGIO 12GX3 LT BLUE (NEEDLE) ×1 IMPLANT
IV NS 1000ML BAXH (IV SOLUTION) IMPLANT
KIT PINK PAD W/HEAD ARE REST (MISCELLANEOUS) ×2 IMPLANT
KIT PINK PAD W/HEAD ARM REST (MISCELLANEOUS) ×1 IMPLANT
LABEL OR SOLS (LABEL) IMPLANT
MANIFOLD NEPTUNE II (INSTRUMENTS) ×1 IMPLANT
MESH 3DMAX MID 5X7 LT XLRG (Mesh General) ×1 IMPLANT
NDL DRIVE SUT CUT DVNC (INSTRUMENTS) ×1 IMPLANT
NDL HYPO 22X1.5 SAFETY MO (MISCELLANEOUS) ×1 IMPLANT
NDL INSUFFLATION 14GA 120MM (NEEDLE) ×1 IMPLANT
NEEDLE DRIVE SUT CUT DVNC (INSTRUMENTS) ×2 IMPLANT
NEEDLE HYPO 22X1.5 SAFETY MO (MISCELLANEOUS) ×2 IMPLANT
NEEDLE INSUFFLATION 14GA 120MM (NEEDLE) ×2 IMPLANT
OBTURATOR OPTICAL STND 8 DVNC (TROCAR) ×2 IMPLANT
OBTURATOR OPTICALSTD 8 DVNC (TROCAR) ×1 IMPLANT
PACK LAP CHOLECYSTECTOMY (MISCELLANEOUS) ×2 IMPLANT
SCISSORS MNPLR CVD DVNC XI (INSTRUMENTS) ×2 IMPLANT
SEAL UNIV 5-12 XI (MISCELLANEOUS) ×6 IMPLANT
SET TUBE SMOKE EVAC HIGH FLOW (TUBING) ×2 IMPLANT
SOL ELECTROSURG ANTI STICK (MISCELLANEOUS) ×2 IMPLANT
SOLUTION ELECTROSURG ANTI STCK (MISCELLANEOUS) ×1 IMPLANT
SUT MNCRL 4-0 27 PS-2 XMFL (SUTURE) ×2 IMPLANT
SUT STRATA 2-0 23CM CT-2 (SUTURE) ×4 IMPLANT
SUT VIC AB 2-0 SH 27XBRD (SUTURE) ×2 IMPLANT
SUT VIC AB 2-0 UR6 27 (SUTURE) ×1 IMPLANT
SUTURE MNCRL 4-0 27XMF (SUTURE) ×1 IMPLANT
TAPE TRANSPORE STRL 2 31045 (GAUZE/BANDAGES/DRESSINGS) IMPLANT
TRAP FLUID SMOKE EVACUATOR (MISCELLANEOUS) ×1 IMPLANT
TRAY FOLEY MTR SLVR 16FR STAT (SET/KITS/TRAYS/PACK) ×1 IMPLANT
WATER STERILE IRR 500ML POUR (IV SOLUTION) ×1 IMPLANT

## 2023-03-28 NOTE — Interval H&P Note (Signed)
 History and Physical Interval Note:  03/28/2023 7:06 AM  Christopher Mcdaniel.  has presented today for surgery, with the diagnosis of K40.20 non recurrent bil inguinal hernia w/o obstruction or gangrene.  The various methods of treatment have been discussed with the patient and family. After consideration of risks, benefits and other options for treatment, the patient has consented to  Procedure(s): REPAIR, HERNIA, INGUINAL, BILATERAL, ROBOT-ASSISTED (Bilateral) as a surgical intervention.  The patient's history has been reviewed, patient examined, no change in status, stable for surgery.  I have reviewed the patient's chart and labs.  Questions were answered to the patient's satisfaction.     Carolan Shiver

## 2023-03-28 NOTE — Discharge Instructions (Signed)

## 2023-03-28 NOTE — Op Note (Signed)
 Preoperative diagnosis: Bilateral inguinal hernia.   Postoperative diagnosis: Bilateral Direct inguinal hernia.  Procedure: Robotic assisted Laparoscopic Transabdominal preperitoneal laparoscopic (TAPP) repair of bilateral inguinal hernia.  Anesthesia: GETA  Surgeon: Dr. Hazle Quant  Wound Classification: Clean  Indications:  Patient is a 53 y.o. male developed a symptomatic bilateral inguinal hernia. Repair was indicated.  Findings: 1. Bilateral Direct Inguinal hernia identified 2. Vas deferens and cord structures identified and preserved 3. Bard Extra Large 3D Max MID Anatomical mesh used for repair 4. Adequate hemostasis.   Description of procedure:  The patient was taken to the operating room and the correct side of surgery was verified. The patient was placed supine with right arm tucked at the side. After obtaining adequate anesthesia, the patient's abdomen was prepped and draped in standard sterile fashion. A time-out was completed verifying correct patient, procedure, site, positioning, and implant(s) and/or special equipment prior to beginning this procedure.  An incision was made in a natural skin line above the umbilicus. The fascia was elevated and the Veress needle inserted. Proper position was confirmed by aspiration and saline meniscus test.  The abdomen was insufflated with carbon dioxide to a pressure of 15 mmHg. The patient tolerated insufflation well.  Abdominal cavity was entered using Optiview technique with a millimeter trocar.  No injury was identified.  Another 2 mm trocars were placed lateral to each rectus muscle.  Scissors and bipolar forceps were inserted under direct visualization. At the robotic console, both hernias repaired using the following technique: Transverse peritoneal incision is made about 8 cm superior to the inguinal defect. Medial to the epigastric vessels, the parietal compartment is dissected to visualize the rectus muscle. This is carried down  to the symphysis pubis and the retropubic space is dissected to expose at least 2 cm contralateral to the midline. Cooper's ligament is exposed and cleared at least 2 cm below the ligament to ensure adequate space for the inferior border of the mesh. Hesselbach's triangle is cleared assessing for a direct hernia. The hernia is reduced dissecting the contents away from the border of the transversalis (white) fascia. The pseudosac was imbricated onto Cooper's ligament in lieau of closure of the direct defect to prevent injury to the anterior nerves and cord structures. Lateral to the epigastric vessels, the dissection is carried out in visceral compartment continuing in the true preperitoneal plane. This dissection was continued until the cord structures are "parietalized" completely, allowing for visualization of the reflected peritoneum that is continuous with the line originating 2 cm below Coopers medially and across the psoas muscle in the lateral compartment.  The internal ring was interrogated for a cord lipoma. The cord lipoma was reduced to the retroperitoneum and seated dorsal to the preperitoneal mesh. Having achieved a complete dissection with a critical view of the entire myopectineal orifice on both sides, a Right and Left XL mesh were then positioned centered at the iliopubic tract with the medial side crossing the midline and the inferior edge positioned 2 cm below Coopers ligament. The lateral aspect of the mesh extended 3-5 cm beyond the lateral edge of the psoas. The mesh is fixated using an interrupted suture placed to the ipsilateral Coopers ligament. A second suture was done at the medial superior aspect of the mesh fixating this to the rectus complex.  The peritoneal flap is closed with running barbed suture. Additional holes in the peritoneum closed with suture. Preperitoneal space gas aspirated to visualize the peritoneum apposed directly against the mesh and ensure no  folding, lifting, or  buckling of the mesh. Skin is closed, sterile dressings are applied.  The patient tolerated the procedure well and was taken to the postanesthesia care unit in stable condition  Specimen: None  Complications: None  Estimated Blood Loss: 5 mL

## 2023-03-28 NOTE — Anesthesia Preprocedure Evaluation (Signed)
 Anesthesia Evaluation  Patient identified by MRN, date of birth, ID band Patient awake    Reviewed: Allergy & Precautions, H&P , NPO status , Patient's Chart, lab work & pertinent test results, reviewed documented beta blocker date and time   History of Anesthesia Complications (+) history of anesthetic complications  Airway Mallampati: II  TM Distance: >3 FB Neck ROM: full    Dental  (+) Teeth Intact, Poor Dentition, Chipped   Pulmonary neg pulmonary ROS   Pulmonary exam normal        Cardiovascular Exercise Tolerance: Good hypertension, On Medications negative cardio ROS Normal cardiovascular exam Rhythm:regular Rate:Normal     Neuro/Psych negative neurological ROS  negative psych ROS   GI/Hepatic Neg liver ROS, hiatal hernia,GERD  Medicated,,  Endo/Other  negative endocrine ROS    Renal/GU negative Renal ROS  negative genitourinary   Musculoskeletal   Abdominal   Peds  Hematology negative hematology ROS (+)   Anesthesia Other Findings Past Medical History: No date: Aphakia, right No date: Arthritis     Comment:  knees No date: Cataract cortical, senile, left No date: Chronic kidney disease No date: CME (cystoid macular edema) No date: Complication of anesthesia     Comment:  wakes up during surgery 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 No date: GERD (gastroesophageal reflux disease) No date: History of hiatal hernia No date: Hyperaldosteronism (HCC) No date: Hypertension No date: Hypotony of eye No date: Macular degeneration No date: Mature cataract No date: Non-recurrent bilateral inguinal hernia without obstruction  or gangrene 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 No date: POAG (primary open-angle glaucoma) 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 Past Surgical History: 2016: ADRENAL GLAND SURGERY; Bilateral     Comment:  biopsy No date: CORNEAL TRANSPLANT 2017: EYE SURGERY No date: fracture repair of  left knee No date: FRACTURE SURGERY     Comment:  FRACTURED LEFT KNEE No date: glaucoma eye surgery No date: lens eye surgery BMI    Body Mass Index: 21.71 kg/m     Reproductive/Obstetrics negative OB ROS                             Anesthesia Physical Anesthesia Plan  ASA: 2  Anesthesia Plan: General ETT   Post-op Pain Management:    Induction:   PONV Risk Score and Plan: 3  Airway Management Planned:   Additional Equipment:   Intra-op Plan:   Post-operative Plan:   Informed Consent: I have reviewed the patients History and Physical, chart, labs and discussed the procedure including the risks, benefits and alternatives for the proposed anesthesia with the patient or authorized representative who has indicated his/her understanding and acceptance.     Dental Advisory Given  Plan Discussed with: CRNA  Anesthesia Plan Comments:        Anesthesia Quick Evaluation

## 2023-03-28 NOTE — Anesthesia Procedure Notes (Signed)
 Procedure Name: Intubation Date/Time: 03/28/2023 7:36 AM  Performed by: Darrell Jewel I, CRNAPre-anesthesia Checklist: Patient identified, Patient being monitored, Timeout performed, Emergency Drugs available and Suction available Patient Re-evaluated:Patient Re-evaluated prior to induction Oxygen Delivery Method: Circle system utilized Preoxygenation: Pre-oxygenation with 100% oxygen Induction Type: IV induction Ventilation: Mask ventilation without difficulty Laryngoscope Size: McGrath and 4 Grade View: Grade I Tube type: Oral Tube size: 7.5 mm Number of attempts: 1 Airway Equipment and Method: Stylet Placement Confirmation: ETT inserted through vocal cords under direct vision, positive ETCO2 and breath sounds checked- equal and bilateral Secured at: 21 cm Tube secured with: Tape Dental Injury: Teeth and Oropharynx as per pre-operative assessment

## 2023-03-28 NOTE — Anesthesia Postprocedure Evaluation (Signed)
 Anesthesia Post Note  Patient: Christopher Mcdaniel.  Procedure(s) Performed: REPAIR, HERNIA, INGUINAL, BILATERAL, ROBOT-ASSISTED (Bilateral: Inguinal) INSERTION OF MESH  Patient location during evaluation: PACU Anesthesia Type: General Level of consciousness: awake and alert Pain management: pain level controlled Vital Signs Assessment: post-procedure vital signs reviewed and stable Respiratory status: spontaneous breathing, nonlabored ventilation, respiratory function stable and patient connected to nasal cannula oxygen Cardiovascular status: blood pressure returned to baseline and stable Postop Assessment: no apparent nausea or vomiting Anesthetic complications: no   No notable events documented.   Last Vitals:  Vitals:   03/28/23 0647 03/28/23 0937  BP: (!) 148/106 (!) 165/102  Pulse: 93 85  Resp: 18 (!) 27  Temp: 36.6 C 36.4 C  SpO2: 97% 100%    Last Pain:  Vitals:   03/28/23 0937  PainSc: Asleep                 Yevette Edwards

## 2023-03-28 NOTE — Transfer of Care (Signed)
 Immediate Anesthesia Transfer of Care Note  Patient: Christopher Mcdaniel.  Procedure(s) Performed: REPAIR, HERNIA, INGUINAL, BILATERAL, ROBOT-ASSISTED (Bilateral: Inguinal) INSERTION OF MESH  Patient Location: PACU  Anesthesia Type:General  Level of Consciousness: awake and alert   Airway & Oxygen Therapy: Patient Spontanous Breathing and Patient connected to face mask oxygen  Post-op Assessment: Report given to RN and Post -op Vital signs reviewed and stable  Post vital signs: stable  Last Vitals:  Vitals Value Taken Time  BP 165/102 03/28/23 0936  Temp    Pulse 85 03/28/23 0938  Resp 19 03/28/23 0938  SpO2 100 % 03/28/23 0938  Vitals shown include unfiled device data.  Last Pain: There were no vitals filed for this visit.       Complications: No notable events documented.

## 2023-03-29 ENCOUNTER — Encounter: Payer: Self-pay | Admitting: General Surgery

## 2023-05-16 ENCOUNTER — Encounter: Payer: Self-pay | Admitting: Internal Medicine

## 2023-05-16 ENCOUNTER — Ambulatory Visit (INDEPENDENT_AMBULATORY_CARE_PROVIDER_SITE_OTHER): Payer: 59 | Admitting: Internal Medicine

## 2023-05-16 DIAGNOSIS — E269 Hyperaldosteronism, unspecified: Secondary | ICD-10-CM | POA: Diagnosis not present

## 2023-05-16 DIAGNOSIS — I10 Essential (primary) hypertension: Secondary | ICD-10-CM

## 2023-05-16 MED ORDER — CHLORTHALIDONE 50 MG PO TABS: 50.0000 mg | ORAL_TABLET | Freq: Every day | ORAL | 1 refills | Status: DC

## 2023-05-16 MED ORDER — SPIRONOLACTONE 100 MG PO TABS: 100.0000 mg | ORAL_TABLET | Freq: Two times a day (BID) | ORAL | 2 refills | Status: DC

## 2023-05-16 MED ORDER — MINOXIDIL 2.5 MG PO TABS
7.5000 mg | ORAL_TABLET | Freq: Two times a day (BID) | ORAL | 2 refills | Status: DC
Start: 2023-05-16 — End: 2023-08-17

## 2023-05-16 NOTE — Progress Notes (Signed)
 Established Patient Office Visit  Subjective:  Patient ID: Christopher Mcdaniel., male    DOB: 08-Dec-1970  Age: 53 y.o. MRN: [id number]  Chief Complaint  Patient presents with   Follow-up    3 month lab results    No new complaints, here for medication refills. Recently underwent bilateral inguinal hernia repair without any complications. BP elevated today but had missed his clonidine  patch for about a week.     No other concerns at this time.   Past Medical History:  Diagnosis Date   Aphakia, right    Arthritis    knees   Cataract cortical, senile, left    Chronic kidney disease    CME (cystoid macular edema)    Complication of anesthesia    wakes up during surgery   Failed corneal transplant    GERD (gastroesophageal reflux disease)    History of hiatal hernia    Hyperaldosteronism (HCC)    Hypertension    Hypotony of eye    Macular degeneration    Mature cataract    Non-recurrent bilateral inguinal hernia without obstruction or gangrene    Panuveitis    Panuveitis    POAG (primary open-angle glaucoma)    Uveitic glaucoma     Past Surgical History:  Procedure Laterality Date   ADRENAL GLAND SURGERY Bilateral 2016   biopsy   CORNEAL TRANSPLANT     EYE SURGERY  2017   fracture repair of left knee     FRACTURE SURGERY     FRACTURED LEFT KNEE   glaucoma eye surgery     INSERTION OF MESH  03/28/2023   Procedure: INSERTION OF MESH;  Surgeon: Eldred Grego, MD;  Location: [facility] ORS;  Service: General;;   lens eye surgery      Social History   Socioeconomic History   Marital status: Married    Spouse name: Not on file   Number of children: Not on file   Years of education: Not on file   Highest education level: Not on file  Occupational History   Not on file  Tobacco Use   Smoking status: Never   Smokeless tobacco: Never  Vaping Use   Vaping status: Never Used  Substance and Sexual Activity   Alcohol use: No   Drug use: No   Sexual  activity: Not on file  Other Topics Concern   Not on file  Social History Narrative   Not on file   Social Drivers of Health   Financial Resource Strain: Not on file  Food Insecurity: Not on file  Transportation Needs: Not on file  Physical Activity: Not on file  Stress: Not on file  Social Connections: Not on file  Intimate Partner Violence: Not on file    Family History  Problem Relation Age of Onset   Allergies Mother    Hypertension Mother    Healthy Father    Cancer Neg Hx     Allergies  Allergen Reactions   Ace Inhibitors Swelling    Lip swelling    Quinapril Swelling    angioedema    Outpatient Medications Prior to Visit  Medication Sig   acetaminophen  (TYLENOL ) 500 MG tablet Take 500 mg by mouth every 6 (six) hours as needed.   allopurinol (ZYLOPRIM) 100 MG tablet Take 100 mg by mouth daily.   Ascorbic Acid (VITAMIN C GUMMIE PO) Take 2 tablets by mouth daily.   atorvastatin  (LIPITOR) 20 MG tablet Take 1 tablet (20 mg total) by mouth  daily.   carvedilol  (COREG ) 25 MG tablet Take 2 tablets (50 mg total) by mouth 2 (two) times daily.   cetirizine (ZYRTEC) 10 MG tablet Take 10 mg by mouth daily.   cholecalciferol (VITAMIN D3) 25 MCG (1000 UNIT) tablet Take 1,000 Units by mouth daily.   cloNIDine  (CATAPRES  - DOSED IN MG/24 HR) 0.3 mg/24hr patch Place 1 patch (0.3 mg total) onto the skin once a week.   dapagliflozin propanediol (FARXIGA) 5 MG TABS tablet Take 5 mg by mouth daily.   doxazosin (CARDURA) 2 MG tablet Take 2 mg by mouth daily.   ELDERBERRY PO Take 2 tablets by mouth daily.   ondansetron  (ZOFRAN ) 4 MG tablet Take 4 mg by mouth every 8 (eight) hours as needed for nausea or vomiting.   pantoprazole (PROTONIX) 40 MG tablet Take 40 mg by mouth 2 (two) times daily.   prednisoLONE acetate (PRED FORTE) 1 % ophthalmic suspension Place 1 drop into both eyes every 4 (four) hours.   [DISCONTINUED] chlorthalidone (HYGROTON) 50 MG tablet TAKE 1 TABLET BY MOUTH EVERY  DAY IN THE MORNING   [DISCONTINUED] minoxidil  (LONITEN ) 2.5 MG tablet Take 3 tablets (7.5 mg total) by mouth 2 (two) times daily.   [DISCONTINUED] spironolactone  (ALDACTONE ) 100 MG tablet Take 1 tablet (100 mg total) by mouth 2 (two) times daily.   [DISCONTINUED] NIFEdipine (ADALAT CC) 90 MG 24 hr tablet Take 90 mg by mouth daily.  (Patient not taking: Reported on 05/11/2022)   [DISCONTINUED] potassium chloride  SA (KLOR-CON  M20) 20 MEQ tablet Take 1 tablet (20 mEq total) by mouth daily. (Patient not taking: Reported on 05/11/2022)   [DISCONTINUED] predniSONE (DELTASONE) 10 MG tablet Take 10 mg by mouth daily with breakfast. (Patient not taking: Reported on 03/25/2023)   [DISCONTINUED] sucralfate (CARAFATE) 1 g tablet Take 1 g by mouth as needed. (Patient not taking: Reported on 03/22/2023)   No facility-administered medications prior to visit.    Review of Systems  Constitutional: Negative.   HENT: Negative.    Eyes: Negative.   Respiratory: Negative.    Cardiovascular: Negative.   Gastrointestinal: Negative.   Genitourinary: Negative.   Skin: Negative.   Neurological: Negative.   Endo/Heme/Allergies: Negative.        Objective:   BP (!) 140/90   Pulse 89   Temp (!) 97.1 F (36.2 C)   Ht 5\' 7"  (1.702 m)   Wt 137 lb (62.1 kg)   SpO2 98%   BMI 21.46 kg/m   Vitals:   05/16/23 1027  BP: (!) 140/90  Pulse: 89  Temp: (!) 97.1 F (36.2 C)  Height: 5\' 7"  (1.702 m)  Weight: 137 lb (62.1 kg)  SpO2: 98%  BMI (Calculated): 21.45    Physical Exam Vitals reviewed.  Constitutional:      Appearance: Normal appearance.  HENT:     Head: Normocephalic.     Left Ear: There is no impacted cerumen.     Nose: Nose normal.     Mouth/Throat:     Mouth: Mucous membranes are moist.     Pharynx: No posterior oropharyngeal erythema.  Eyes:     Extraocular Movements: Extraocular movements intact.     Pupils: Pupils are equal, round, and reactive to light.     Comments: Visually impaired   Cardiovascular:     Rate and Rhythm: Regular rhythm.     Chest Wall: PMI is not displaced.     Pulses: Normal pulses.     Heart sounds: Normal heart sounds. No  murmur heard. Pulmonary:     Effort: Pulmonary effort is normal.     Breath sounds: Normal air entry. No rhonchi or rales.  Abdominal:     General: Abdomen is flat. Bowel sounds are normal. There is abdominal bruit. There is no distension.     Palpations: Abdomen is soft. There is pulsatile mass (left of midline). There is no hepatomegaly, splenomegaly or mass.     Tenderness: There is no abdominal tenderness.  Musculoskeletal:        General: Normal range of motion.     Cervical back: Normal range of motion and neck supple.     Right lower leg: No edema.     Left lower leg: No edema.  Skin:    General: Skin is warm and dry.  Neurological:     General: No focal deficit present.     Mental Status: He is alert and oriented to person, place, and time.     Cranial Nerves: No cranial nerve deficit.     Motor: No weakness.  Psychiatric:        Mood and Affect: Mood normal.        Behavior: Behavior normal.      No results found for any visits on 05/16/23.  No results found for this or any previous visit (from the past 2160 hours).    Assessment & Plan:  As per problem list. chest pain. Patient instructed to comply with medications as prescribed. Problem List Items Addressed This Visit       Cardiovascular and Mediastinum   Essential hypertension   Relevant Medications   spironolactone  (ALDACTONE ) 100 MG tablet   minoxidil  (LONITEN ) 2.5 MG tablet   chlorthalidone (HYGROTON) 50 MG tablet     Endocrine   Hyperaldosteronism (HCC)   Relevant Medications   spironolactone  (ALDACTONE ) 100 MG tablet   Other Visit Diagnoses       Essential (primary) hypertension       Relevant Medications   spironolactone  (ALDACTONE ) 100 MG tablet   minoxidil  (LONITEN ) 2.5 MG tablet   chlorthalidone (HYGROTON) 50 MG tablet        Return in about 2 weeks (around 05/30/2023) for BP followup.   Total time spent: 20 minutes  Arzella Bitters, MD  05/16/2023   This document may have been prepared by [facility] Voice Recognition software and as such may include unintentional dictation errors.

## 2023-05-31 ENCOUNTER — Encounter (INDEPENDENT_AMBULATORY_CARE_PROVIDER_SITE_OTHER): Payer: Self-pay

## 2023-05-31 ENCOUNTER — Ambulatory Visit: Admitting: Internal Medicine

## 2023-06-08 ENCOUNTER — Encounter: Payer: Self-pay | Admitting: Internal Medicine

## 2023-06-08 ENCOUNTER — Ambulatory Visit (INDEPENDENT_AMBULATORY_CARE_PROVIDER_SITE_OTHER): Admitting: Internal Medicine

## 2023-06-08 VITALS — BP 120/90 | HR 84 | Temp 97.0°F | Ht 67.0 in | Wt 141.6 lb

## 2023-06-08 DIAGNOSIS — I10 Essential (primary) hypertension: Secondary | ICD-10-CM

## 2023-06-08 DIAGNOSIS — I714 Abdominal aortic aneurysm, without rupture, unspecified: Secondary | ICD-10-CM | POA: Diagnosis not present

## 2023-06-08 DIAGNOSIS — E269 Hyperaldosteronism, unspecified: Secondary | ICD-10-CM | POA: Diagnosis not present

## 2023-06-08 MED ORDER — CLONIDINE 0.3 MG/24HR TD PTWK: 0.3000 mg | MEDICATED_PATCH | TRANSDERMAL | 2 refills | Status: DC

## 2023-06-08 NOTE — Progress Notes (Signed)
 Established Patient Office Visit  Subjective:  Patient ID: Christopher Mcdaniel., male    DOB: 01-30-1970  Age: 53 y.o. MRN: [id number]  Chief Complaint  Patient presents with   Follow-up    BP follow up    Here for BP follow up. Not wearing his clonidine  patch presently as it fell off this morning and he hasn't reapplied it.    No other concerns at this time.   Past Medical History:  Diagnosis Date   Aphakia, right    Arthritis    knees   Cataract cortical, senile, left    Chronic kidney disease    CME (cystoid macular edema)    Complication of anesthesia    wakes up during surgery   Failed corneal transplant    GERD (gastroesophageal reflux disease)    History of hiatal hernia    Hyperaldosteronism (HCC)    Hypertension    Hypotony of eye    Macular degeneration    Mature cataract    Non-recurrent bilateral inguinal hernia without obstruction or gangrene    Panuveitis    Panuveitis    POAG (primary open-angle glaucoma)    Uveitic glaucoma     Past Surgical History:  Procedure Laterality Date   ADRENAL GLAND SURGERY Bilateral 2016   biopsy   CORNEAL TRANSPLANT     EYE SURGERY  2017   fracture repair of left knee     FRACTURE SURGERY     FRACTURED LEFT KNEE   glaucoma eye surgery     INSERTION OF MESH  03/28/2023   Procedure: INSERTION OF MESH;  Surgeon: Eldred Grego, MD;  Location: [facility] ORS;  Service: General;;   lens eye surgery      Social History   Socioeconomic History   Marital status: Married    Spouse name: Not on file   Number of children: Not on file   Years of education: Not on file   Highest education level: Not on file  Occupational History   Not on file  Tobacco Use   Smoking status: Never   Smokeless tobacco: Never  Vaping Use   Vaping status: Never Used  Substance and Sexual Activity   Alcohol use: No   Drug use: No   Sexual activity: Not on file  Other Topics Concern   Not on file  Social History Narrative   Not  on file   Social Drivers of Health   Financial Resource Strain: Not on file  Food Insecurity: Not on file  Transportation Needs: Not on file  Physical Activity: Not on file  Stress: Not on file  Social Connections: Not on file  Intimate Partner Violence: Not on file    Family History  Problem Relation Age of Onset   Allergies Mother    Hypertension Mother    Healthy Father    Cancer Neg Hx     Allergies  Allergen Reactions   Ace Inhibitors Swelling    Lip swelling    Quinapril Swelling    angioedema    Outpatient Medications Prior to Visit  Medication Sig   acetaminophen  (TYLENOL ) 500 MG tablet Take 500 mg by mouth every 6 (six) hours as needed.   allopurinol (ZYLOPRIM) 100 MG tablet Take 100 mg by mouth daily.   Ascorbic Acid (VITAMIN C GUMMIE PO) Take 2 tablets by mouth daily.   atorvastatin  (LIPITOR) 20 MG tablet Take 1 tablet (20 mg total) by mouth daily.   carvedilol  (COREG ) 25 MG tablet Take  2 tablets (50 mg total) by mouth 2 (two) times daily.   chlorthalidone  (HYGROTON ) 50 MG tablet Take 1 tablet (50 mg total) by mouth daily.   cholecalciferol (VITAMIN D3) 25 MCG (1000 UNIT) tablet Take 1,000 Units by mouth daily.   dapagliflozin propanediol (FARXIGA) 5 MG TABS tablet Take 5 mg by mouth daily.   doxazosin (CARDURA) 2 MG tablet Take 2 mg by mouth daily.   ELDERBERRY PO Take 2 tablets by mouth daily.   minoxidil  (LONITEN ) 2.5 MG tablet Take 3 tablets (7.5 mg total) by mouth 2 (two) times daily.   prednisoLONE acetate (PRED FORTE) 1 % ophthalmic suspension Place 1 drop into both eyes every 4 (four) hours.   spironolactone  (ALDACTONE ) 100 MG tablet Take 1 tablet (100 mg total) by mouth 2 (two) times daily.   [DISCONTINUED] cloNIDine  (CATAPRES  - DOSED IN MG/24 HR) 0.3 mg/24hr patch Place 1 patch (0.3 mg total) onto the skin once a week.   cetirizine (ZYRTEC) 10 MG tablet Take 10 mg by mouth daily. (Patient not taking: Reported on 06/08/2023)   ondansetron  (ZOFRAN ) 4 MG  tablet Take 4 mg by mouth every 8 (eight) hours as needed for nausea or vomiting. (Patient not taking: Reported on 06/08/2023)   pantoprazole (PROTONIX) 40 MG tablet Take 40 mg by mouth 2 (two) times daily. (Patient not taking: Reported on 06/08/2023)   No facility-administered medications prior to visit.    Review of Systems  Constitutional: Negative.   HENT: Negative.    Eyes: Negative.   Respiratory: Negative.    Cardiovascular: Negative.   Gastrointestinal: Negative.   Genitourinary: Negative.   Skin: Negative.   Neurological: Negative.   Endo/Heme/Allergies: Negative.        Objective:   BP (!) 120/90 (Cuff Size: Normal)   Pulse 84   Temp (!) 97 F (36.1 C) (Tympanic)   Ht 5\' 7"  (1.702 m)   Wt 141 lb 9.6 oz (64.2 kg)   SpO2 99%   BMI 22.18 kg/m   Vitals:   06/08/23 1115 06/08/23 1131  BP: (!) 144/104 (!) 120/90  Pulse: 84   Temp: (!) 97 F (36.1 C)   Height: 5\' 7"  (1.702 m)   Weight: 141 lb 9.6 oz (64.2 kg)   SpO2: 99%   TempSrc: Tympanic   BMI (Calculated): 22.17     Physical Exam Vitals reviewed.  Constitutional:      Appearance: Normal appearance.  HENT:     Head: Normocephalic.     Left Ear: There is no impacted cerumen.     Nose: Nose normal.     Mouth/Throat:     Mouth: Mucous membranes are moist.     Pharynx: No posterior oropharyngeal erythema.  Eyes:     Extraocular Movements: Extraocular movements intact.     Pupils: Pupils are equal, round, and reactive to light.     Comments: Visually impaired  Cardiovascular:     Rate and Rhythm: Regular rhythm.     Chest Wall: PMI is not displaced.     Pulses: Normal pulses.     Heart sounds: Normal heart sounds. No murmur heard. Pulmonary:     Effort: Pulmonary effort is normal.     Breath sounds: Normal air entry. No rhonchi or rales.  Abdominal:     General: Abdomen is flat. Bowel sounds are normal. There is abdominal bruit. There is no distension.     Palpations: Abdomen is soft. There is  pulsatile mass (left of midline). There is no  hepatomegaly, splenomegaly or mass.     Tenderness: There is no abdominal tenderness.  Musculoskeletal:        General: Normal range of motion.     Cervical back: Normal range of motion and neck supple.     Right lower leg: No edema.     Left lower leg: No edema.  Skin:    General: Skin is warm and dry.  Neurological:     General: No focal deficit present.     Mental Status: He is alert and oriented to person, place, and time.     Cranial Nerves: No cranial nerve deficit.     Motor: No weakness.  Psychiatric:        Mood and Affect: Mood normal.        Behavior: Behavior normal.      No results found for any visits on 06/08/23.  No results found for this or any previous visit (from the past 2160 hours).    Assessment & Plan:  As per problem list  Problem List Items Addressed This Visit       Cardiovascular and Mediastinum   AAA (abdominal aortic aneurysm) without rupture (HCC) - Primary   Relevant Medications   cloNIDine  (CATAPRES  - DOSED IN MG/24 HR) 0.3 mg/24hr patch     Endocrine   Hyperaldosteronism (HCC)   Other Visit Diagnoses       Essential (primary) hypertension       Relevant Medications   cloNIDine  (CATAPRES  - DOSED IN MG/24 HR) 0.3 mg/24hr patch   Other Relevant Orders   CBC With Diff/Platelet   Comprehensive metabolic panel with GFR       Return in about 9 weeks (around 08/10/2023) for BP followup, lab results.   Total time spent: 20 minutes  Arzella Bitters, MD  06/08/2023   This document may have been prepared by [facility] Voice Recognition software and as such may include unintentional dictation errors.

## 2023-06-10 ENCOUNTER — Telehealth: Payer: Self-pay

## 2023-06-10 NOTE — Progress Notes (Addendum)
   06/10/2023  Patient ID: Christopher Mcdaniel., male   DOB: April 19, 1970, 53 y.o.   MRN: [id number]  Pharmacy Quality Measure Review  This patient is appearing on a report for being at risk of failing the adherence measure for diabetes medications this calendar year.   Medication: Farxiga 5mg  Last fill date: 04/23/23 for 30 day supply  Left voicemail for patient to return my call at their convenience.  Carnell Christian, PharmD Clinical Pharmacist [phone]

## 2023-06-27 NOTE — Progress Notes (Signed)
   06/27/2023  Patient ID: Christopher Mcdaniel., male   DOB: 04-Jul-1970, 53 y.o.   MRN: [id number]  Pharmacy Quality Measure Review  This patient is appearing on a report for being at risk of failing the adherence measure for diabetes medications this calendar year.   Medication: Farxiga 5mg  Last fill date: 06/09/23 for 30 day supply  Insurance report was not up to date. No action needed at this time.   Carnell Christian, PharmD Clinical Pharmacist [phone]

## 2023-06-28 ENCOUNTER — Other Ambulatory Visit (INDEPENDENT_AMBULATORY_CARE_PROVIDER_SITE_OTHER)

## 2023-06-28 ENCOUNTER — Ambulatory Visit (INDEPENDENT_AMBULATORY_CARE_PROVIDER_SITE_OTHER): Admitting: Vascular Surgery

## 2023-07-28 ENCOUNTER — Telehealth: Payer: Self-pay

## 2023-07-28 NOTE — Progress Notes (Signed)
   07/28/2023  Patient ID: Christopher Mcdaniel., male   DOB: 1970/03/04, 53 y.o.   MRN: [id number]  Pharmacy Quality Measure Review  This patient is appearing on a report for being at risk of failing the adherence measure for diabetes medications this calendar year.   Medication: Farxiga 5mg  Last fill date: 06/11/23 for 30 day supply  Contacted pharmacy to facilitate refills. and patient plans to pick up today  Jon VEAR Lindau, PharmD Clinical Pharmacist [phone]

## 2023-08-05 ENCOUNTER — Ambulatory Visit (INDEPENDENT_AMBULATORY_CARE_PROVIDER_SITE_OTHER): Admitting: Vascular Surgery

## 2023-08-05 ENCOUNTER — Other Ambulatory Visit (INDEPENDENT_AMBULATORY_CARE_PROVIDER_SITE_OTHER)

## 2023-08-17 ENCOUNTER — Ambulatory Visit (INDEPENDENT_AMBULATORY_CARE_PROVIDER_SITE_OTHER): Admitting: Internal Medicine

## 2023-08-17 ENCOUNTER — Encounter: Payer: Self-pay | Admitting: Internal Medicine

## 2023-08-17 DIAGNOSIS — E269 Hyperaldosteronism, unspecified: Secondary | ICD-10-CM | POA: Diagnosis not present

## 2023-08-17 DIAGNOSIS — E782 Mixed hyperlipidemia: Secondary | ICD-10-CM

## 2023-08-17 DIAGNOSIS — I10 Essential (primary) hypertension: Secondary | ICD-10-CM | POA: Diagnosis not present

## 2023-08-17 MED ORDER — CARVEDILOL 25 MG PO TABS: 50.0000 mg | ORAL_TABLET | Freq: Two times a day (BID) | ORAL | 1 refills | Status: DC

## 2023-08-17 MED ORDER — ATORVASTATIN CALCIUM 20 MG PO TABS: 20.0000 mg | ORAL_TABLET | Freq: Every day | ORAL | 1 refills | Status: DC

## 2023-08-17 MED ORDER — MINOXIDIL 2.5 MG PO TABS: 7.5000 mg | ORAL_TABLET | Freq: Two times a day (BID) | ORAL | 2 refills | Status: DC

## 2023-08-17 MED ORDER — SPIRONOLACTONE 100 MG PO TABS
100.0000 mg | ORAL_TABLET | Freq: Two times a day (BID) | ORAL | 2 refills | Status: DC
Start: 2023-08-17 — End: 2023-10-24

## 2023-08-17 NOTE — Progress Notes (Signed)
 Established Patient Office Visit  Subjective:  Patient ID: Christopher Macmurray., male    DOB: [birth year]  Age: 53 y.o. MRN: [id number]  Chief Complaint  Patient presents with   Follow-up    BP follow up with lab results.     No new complaints, here for lab review and medication refills. Insurance now denying his catapress patch. Failed to have previsit labs done.     No other concerns at this time.   Past Medical History:  Diagnosis Date   Aphakia, right    Arthritis    knees   Cataract cortical, senile, left    Chronic kidney disease    CME (cystoid macular edema)    Complication of anesthesia    wakes up during surgery   Failed corneal transplant    GERD (gastroesophageal reflux disease)    History of hiatal hernia    Hyperaldosteronism (HCC)    Hypertension    Hypotony of eye    Macular degeneration    Mature cataract    Non-recurrent bilateral inguinal hernia without obstruction or gangrene    Panuveitis    Panuveitis    POAG (primary open-angle glaucoma)    Uveitic glaucoma     Past Surgical History:  Procedure Laterality Date   ADRENAL GLAND SURGERY Bilateral 2016   biopsy   CORNEAL TRANSPLANT     EYE SURGERY  2017   fracture repair of left knee     FRACTURE SURGERY     FRACTURED LEFT KNEE   glaucoma eye surgery     INSERTION OF MESH  03/28/2023   Procedure: INSERTION OF MESH;  Surgeon: Rodolph Romano, MD;  Location: [facility] ORS;  Service: General;;   lens eye surgery      Social History   Socioeconomic History   Marital status: Married    Spouse name: Not on file   Number of children: Not on file   Years of education: Not on file   Highest education level: Not on file  Occupational History   Not on file  Tobacco Use   Smoking status: Never   Smokeless tobacco: Never  Vaping Use   Vaping status: Never Used  Substance and Sexual Activity   Alcohol use: No   Drug use: No   Sexual activity: Not on file  Other Topics Concern   Not  on file  Social History Narrative   Not on file   Social Drivers of Health   Financial Resource Strain: Not on file  Food Insecurity: Not on file  Transportation Needs: Not on file  Physical Activity: Not on file  Stress: Not on file  Social Connections: Not on file  Intimate Partner Violence: Not on file    Family History  Problem Relation Age of Onset   Allergies Mother    Hypertension Mother    Healthy Father    Cancer Neg Hx     Allergies  Allergen Reactions   Ace Inhibitors Swelling    Lip swelling    Quinapril Swelling    angioedema    Outpatient Medications Prior to Visit  Medication Sig   acetaminophen  (TYLENOL ) 500 MG tablet Take 500 mg by mouth every 6 (six) hours as needed.   allopurinol (ZYLOPRIM) 100 MG tablet Take 100 mg by mouth daily.   Ascorbic Acid (VITAMIN C GUMMIE PO) Take 2 tablets by mouth daily.   chlorthalidone  (HYGROTON ) 50 MG tablet Take 1 tablet (50 mg total) by mouth daily.   cholecalciferol (  VITAMIN D3) 25 MCG (1000 UNIT) tablet Take 1,000 Units by mouth daily.   cloNIDine  (CATAPRES  - DOSED IN MG/24 HR) 0.3 mg/24hr patch Place 1 patch (0.3 mg total) onto the skin once a week.   dapagliflozin propanediol (FARXIGA) 5 MG TABS tablet Take 5 mg by mouth daily.   doxazosin (CARDURA) 2 MG tablet Take 2 mg by mouth daily.   ELDERBERRY PO Take 2 tablets by mouth daily.   prednisoLONE acetate (PRED FORTE) 1 % ophthalmic suspension Place 1 drop into both eyes every 4 (four) hours.   [DISCONTINUED] atorvastatin  (LIPITOR) 20 MG tablet Take 1 tablet (20 mg total) by mouth daily.   [DISCONTINUED] carvedilol  (COREG ) 25 MG tablet Take 2 tablets (50 mg total) by mouth 2 (two) times daily.   [DISCONTINUED] minoxidil  (LONITEN ) 2.5 MG tablet Take 3 tablets (7.5 mg total) by mouth 2 (two) times daily.   [DISCONTINUED] spironolactone  (ALDACTONE ) 100 MG tablet Take 1 tablet (100 mg total) by mouth 2 (two) times daily.   cetirizine (ZYRTEC) 10 MG tablet Take 10 mg  by mouth daily. (Patient not taking: Reported on 08/17/2023)   ondansetron  (ZOFRAN ) 4 MG tablet Take 4 mg by mouth every 8 (eight) hours as needed for nausea or vomiting. (Patient not taking: Reported on 08/17/2023)   pantoprazole (PROTONIX) 40 MG tablet Take 40 mg by mouth 2 (two) times daily. (Patient not taking: Reported on 08/17/2023)   No facility-administered medications prior to visit.    Review of Systems  Constitutional: Negative.   HENT: Negative.    Eyes: Negative.   Respiratory: Negative.    Cardiovascular: Negative.   Gastrointestinal: Negative.   Genitourinary: Negative.   Skin: Negative.   Neurological: Negative.   Endo/Heme/Allergies: Negative.        Objective:   BP (!) 131/90   Pulse 90   Ht 5' 7 (1.702 m)   Wt 139 lb 12.8 oz (63.4 kg)   SpO2 99%   BMI 21.90 kg/m   Vitals:   08/17/23 1052  BP: (!) 131/90  Pulse: 90  Height: 5' 7 (1.702 m)  Weight: 139 lb 12.8 oz (63.4 kg)  SpO2: 99%  BMI (Calculated): 21.89    Physical Exam Vitals reviewed.  Constitutional:      Appearance: Normal appearance.  HENT:     Head: Normocephalic.     Left Ear: There is no impacted cerumen.     Nose: Nose normal.     Mouth/Throat:     Mouth: Mucous membranes are moist.     Pharynx: No posterior oropharyngeal erythema.  Eyes:     Extraocular Movements: Extraocular movements intact.     Pupils: Pupils are equal, round, and reactive to light.     Comments: Visually impaired  Cardiovascular:     Rate and Rhythm: Regular rhythm.     Chest Wall: PMI is not displaced.     Pulses: Normal pulses.     Heart sounds: Normal heart sounds. No murmur heard. Pulmonary:     Effort: Pulmonary effort is normal.     Breath sounds: Normal air entry. No rhonchi or rales.  Abdominal:     General: Abdomen is flat. Bowel sounds are normal. There is abdominal bruit. There is no distension.     Palpations: Abdomen is soft. There is pulsatile mass (left of midline). There is no  hepatomegaly, splenomegaly or mass.     Tenderness: There is no abdominal tenderness.  Musculoskeletal:        General: Normal  range of motion.     Cervical back: Normal range of motion and neck supple.     Right lower leg: No edema.     Left lower leg: No edema.  Skin:    General: Skin is warm and dry.  Neurological:     General: No focal deficit present.     Mental Status: He is alert and oriented to person, place, and time.     Cranial Nerves: No cranial nerve deficit.     Motor: No weakness.  Psychiatric:        Mood and Affect: Mood normal.        Behavior: Behavior normal.      No results found for any visits on 08/17/23.  No results found for this or any previous visit (from the past 2160 hours).    Assessment & Plan:  Christopher Mcdaniel was seen today for follow-up.  Essential hypertension -     Minoxidil ; Take 3 tablets (7.5 mg total) by mouth 2 (two) times daily.  Dispense: 180 tablet; Refill: 2  Hyperaldosteronism (HCC) -     Spironolactone ; Take 1 tablet (100 mg total) by mouth 2 (two) times daily.  Dispense: 60 tablet; Refill: 2  Mixed hyperlipidemia -     Atorvastatin  Calcium ; Take 1 tablet (20 mg total) by mouth daily.  Dispense: 90 tablet; Refill: 1  Essential (primary) hypertension -     Carvedilol ; Take 2 tablets (50 mg total) by mouth 2 (two) times daily.  Dispense: 360 tablet; Refill: 1    Problem List Items Addressed This Visit       Cardiovascular and Mediastinum   Essential hypertension   Relevant Medications   minoxidil  (LONITEN ) 2.5 MG tablet   spironolactone  (ALDACTONE ) 100 MG tablet   atorvastatin  (LIPITOR) 20 MG tablet   carvedilol  (COREG ) 25 MG tablet     Endocrine   Hyperaldosteronism (HCC)   Relevant Medications   spironolactone  (ALDACTONE ) 100 MG tablet     Other   Hyperlipidemia   Relevant Medications   minoxidil  (LONITEN ) 2.5 MG tablet   spironolactone  (ALDACTONE ) 100 MG tablet   atorvastatin  (LIPITOR) 20 MG tablet   carvedilol   (COREG ) 25 MG tablet   Other Visit Diagnoses       Essential (primary) hypertension       Relevant Medications   minoxidil  (LONITEN ) 2.5 MG tablet   spironolactone  (ALDACTONE ) 100 MG tablet   atorvastatin  (LIPITOR) 20 MG tablet   carvedilol  (COREG ) 25 MG tablet       Return in about 2 weeks (around 08/31/2023) for lab results.   Total time spent: 20 minutes  Sherrill Cinderella Perry, MD  08/17/2023   This document may have been prepared by [facility] Voice Recognition software and as such may include unintentional dictation errors.

## 2023-08-31 ENCOUNTER — Ambulatory Visit: Admitting: Internal Medicine

## 2023-09-09 ENCOUNTER — Other Ambulatory Visit

## 2023-09-09 DIAGNOSIS — I10 Essential (primary) hypertension: Secondary | ICD-10-CM | POA: Diagnosis not present

## 2023-09-14 ENCOUNTER — Ambulatory Visit: Payer: Self-pay | Admitting: Internal Medicine

## 2023-09-14 ENCOUNTER — Encounter: Payer: Self-pay | Admitting: Internal Medicine

## 2023-09-14 ENCOUNTER — Ambulatory Visit (INDEPENDENT_AMBULATORY_CARE_PROVIDER_SITE_OTHER): Admitting: Internal Medicine

## 2023-09-14 VITALS — BP 170/116 | HR 85 | Ht 67.0 in | Wt 139.2 lb

## 2023-09-14 DIAGNOSIS — I10 Essential (primary) hypertension: Secondary | ICD-10-CM

## 2023-09-14 DIAGNOSIS — E269 Hyperaldosteronism, unspecified: Secondary | ICD-10-CM

## 2023-09-14 DIAGNOSIS — N184 Chronic kidney disease, stage 4 (severe): Secondary | ICD-10-CM | POA: Diagnosis not present

## 2023-09-14 LAB — COMPREHENSIVE METABOLIC PANEL WITH GFR
ALT: 9 IU/L (ref 0–44)
AST: 15 IU/L (ref 0–40)
Albumin: 4.1 g/dL (ref 3.8–4.9)
Alkaline Phosphatase: 79 IU/L (ref 44–121)
BUN/Creatinine Ratio: 12 (ref 9–20)
BUN: 40 mg/dL — ABNORMAL HIGH (ref 6–24)
Bilirubin Total: <0.2 mg/dL (ref 0.0–1.2)
CO2: 22 mmol/L (ref 20–29)
Calcium: 9.6 mg/dL (ref 8.7–10.2)
Chloride: 100 mmol/L (ref 96–106)
Creatinine, Ser: 3.45 mg/dL — ABNORMAL HIGH (ref 0.76–1.27)
Globulin, Total: 3.3 g/dL (ref 1.5–4.5)
Glucose: 99 mg/dL (ref 70–99)
Potassium: 4.9 mmol/L (ref 3.5–5.2)
Sodium: 136 mmol/L (ref 134–144)
Total Protein: 7.4 g/dL (ref 6.0–8.5)
eGFR: 20 mL/min/1.73 — ABNORMAL LOW (ref >59)

## 2023-09-14 LAB — CBC WITH DIFF/PLATELET

## 2023-09-14 NOTE — Progress Notes (Signed)
 Established Patient Office Visit  Subjective:  Patient ID: Christopher Mcdaniel., male    DOB: 1970/08/29  Age: 53 y.o. MRN: [id number]  Chief Complaint  Patient presents with   Follow-up    2 week follow up, discuss lab results    No new complaints, here for lab review and medication refills. Admits to noncompliance with meds and his bp is elevated today. Labs reviewed and notable for declining gfr however failed to keep nephrology appt.     No other concerns at this time.   Past Medical History:  Diagnosis Date   Aphakia, right    Arthritis    knees   Cataract cortical, senile, left    Chronic kidney disease    CME (cystoid macular edema)    Complication of anesthesia    wakes up during surgery   Failed corneal transplant    GERD (gastroesophageal reflux disease)    History of hiatal hernia    Hyperaldosteronism (HCC)    Hypertension    Hypotony of eye    Macular degeneration    Mature cataract    Non-recurrent bilateral inguinal hernia without obstruction or gangrene    Panuveitis    Panuveitis    POAG (primary open-angle glaucoma)    Uveitic glaucoma     Past Surgical History:  Procedure Laterality Date   ADRENAL GLAND SURGERY Bilateral 2016   biopsy   CORNEAL TRANSPLANT     EYE SURGERY  2017   fracture repair of left knee     FRACTURE SURGERY     FRACTURED LEFT KNEE   glaucoma eye surgery     INSERTION OF MESH  03/28/2023   Procedure: INSERTION OF MESH;  Surgeon: Rodolph Romano, MD;  Location: [facility] ORS;  Service: General;;   lens eye surgery      Social History   Socioeconomic History   Marital status: Married    Spouse name: Not on file   Number of children: Not on file   Years of education: Not on file   Highest education level: Not on file  Occupational History   Not on file  Tobacco Use   Smoking status: Never   Smokeless tobacco: Never  Vaping Use   Vaping status: Never Used  Substance and Sexual Activity   Alcohol use: No    Drug use: No   Sexual activity: Not on file  Other Topics Concern   Not on file  Social History Narrative   Not on file   Social Drivers of Health   Financial Resource Strain: Not on file  Food Insecurity: Not on file  Transportation Needs: Not on file  Physical Activity: Not on file  Stress: Not on file  Social Connections: Not on file  Intimate Partner Violence: Not on file    Family History  Problem Relation Age of Onset   Allergies Mother    Hypertension Mother    Healthy Father    Cancer Neg Hx     Allergies  Allergen Reactions   Ace Inhibitors Swelling    Lip swelling    Quinapril Swelling    angioedema    Outpatient Medications Prior to Visit  Medication Sig   acetaminophen  (TYLENOL ) 500 MG tablet Take 500 mg by mouth every 6 (six) hours as needed.   allopurinol (ZYLOPRIM) 100 MG tablet Take 100 mg by mouth daily.   Ascorbic Acid (VITAMIN C GUMMIE PO) Take 2 tablets by mouth daily.   atorvastatin  (LIPITOR) 20 MG tablet  Take 1 tablet (20 mg total) by mouth daily.   carvedilol  (COREG ) 25 MG tablet Take 2 tablets (50 mg total) by mouth 2 (two) times daily.   chlorthalidone  (HYGROTON ) 50 MG tablet Take 1 tablet (50 mg total) by mouth daily.   cholecalciferol (VITAMIN D3) 25 MCG (1000 UNIT) tablet Take 1,000 Units by mouth daily.   cloNIDine  (CATAPRES  - DOSED IN MG/24 HR) 0.3 mg/24hr patch Place 1 patch (0.3 mg total) onto the skin once a week.   dapagliflozin propanediol (FARXIGA) 5 MG TABS tablet Take 5 mg by mouth daily.   doxazosin (CARDURA) 2 MG tablet Take 2 mg by mouth daily.   ELDERBERRY PO Take 2 tablets by mouth daily.   minoxidil  (LONITEN ) 2.5 MG tablet Take 3 tablets (7.5 mg total) by mouth 2 (two) times daily.   prednisoLONE acetate (PRED FORTE) 1 % ophthalmic suspension Place 1 drop into both eyes every 4 (four) hours.   spironolactone  (ALDACTONE ) 100 MG tablet Take 1 tablet (100 mg total) by mouth 2 (two) times daily.   cetirizine (ZYRTEC) 10 MG  tablet Take 10 mg by mouth daily. (Patient not taking: Reported on 09/14/2023)   ondansetron  (ZOFRAN ) 4 MG tablet Take 4 mg by mouth every 8 (eight) hours as needed for nausea or vomiting. (Patient not taking: Reported on 09/14/2023)   pantoprazole (PROTONIX) 40 MG tablet Take 40 mg by mouth 2 (two) times daily. (Patient not taking: Reported on 09/14/2023)   No facility-administered medications prior to visit.    Review of Systems  Constitutional: Negative.   HENT: Negative.    Eyes: Negative.   Respiratory: Negative.    Cardiovascular: Negative.   Gastrointestinal: Negative.   Genitourinary: Negative.   Skin: Negative.   Neurological: Negative.   Endo/Heme/Allergies: Negative.        Objective:   BP (!) 170/116   Pulse 85   Ht 5' 7 (1.702 m)   Wt 139 lb 3.2 oz (63.1 kg)   SpO2 97%   BMI 21.80 kg/m   Vitals:   09/14/23 1051  BP: (!) 170/116  Pulse: 85  Height: 5' 7 (1.702 m)  Weight: 139 lb 3.2 oz (63.1 kg)  SpO2: 97%  BMI (Calculated): 21.8    Physical Exam Vitals reviewed.  Constitutional:      Appearance: Normal appearance.  HENT:     Head: Normocephalic.     Left Ear: There is no impacted cerumen.     Nose: Nose normal.     Mouth/Throat:     Mouth: Mucous membranes are moist.     Pharynx: No posterior oropharyngeal erythema.  Eyes:     Extraocular Movements: Extraocular movements intact.     Pupils: Pupils are equal, round, and reactive to light.     Comments: Visually impaired  Cardiovascular:     Rate and Rhythm: Regular rhythm.     Chest Wall: PMI is not displaced.     Pulses: Normal pulses.     Heart sounds: Normal heart sounds. No murmur heard. Pulmonary:     Effort: Pulmonary effort is normal.     Breath sounds: Normal air entry. No rhonchi or rales.  Abdominal:     General: Abdomen is flat. Bowel sounds are normal. There is abdominal bruit. There is no distension.     Palpations: Abdomen is soft. There is pulsatile mass (left of midline).  There is no hepatomegaly, splenomegaly or mass.     Tenderness: There is no abdominal tenderness.  Musculoskeletal:  General: Normal range of motion.     Cervical back: Normal range of motion and neck supple.     Right lower leg: No edema.     Left lower leg: No edema.  Skin:    General: Skin is warm and dry.  Neurological:     General: No focal deficit present.     Mental Status: He is alert and oriented to person, place, and time.     Cranial Nerves: No cranial nerve deficit.     Motor: No weakness.  Psychiatric:        Mood and Affect: Mood normal.        Behavior: Behavior normal.      No results found for any visits on 09/14/23.  Recent Results (from the past 2160 hours)  CBC With Diff/Platelet     Status: None   Collection Time: 09/09/23  8:33 AM  Result Value Ref Range   WBC CANCELED x10E3/uL    Comment: Test not performed. Deterioration occurred during specimen handling. [facility] is providing the patient with re-collection instructions.  Result canceled by the ancillary.    RBC CANCELED     Comment: Test not performed  Result canceled by the ancillary.    Hemoglobin CANCELED     Comment: Test not performed  Result canceled by the ancillary.    Hematocrit CANCELED     Comment: Test not performed  Result canceled by the ancillary.    Platelets CANCELED     Comment: Test not performed  Result canceled by the ancillary.    Neutrophils CANCELED     Comment: Test not performed  Result canceled by the ancillary.    Lymphs CANCELED     Comment: Test not performed  Result canceled by the ancillary.    Monocytes CANCELED     Comment: Test not performed  Result canceled by the ancillary.    Eos CANCELED     Comment: Test not performed  Result canceled by the ancillary.    Lymphocytes Absolute CANCELED     Comment: Test not performed  Result canceled by the ancillary.    EOS (ABSOLUTE) CANCELED     Comment: Test not performed  Result  canceled by the ancillary.    Basophils Absolute CANCELED     Comment: Test not performed  Result canceled by the ancillary.   Comprehensive metabolic panel with GFR     Status: Abnormal   Collection Time: 09/09/23  8:33 AM  Result Value Ref Range   Glucose 99 70 - 99 mg/dL   BUN 40 (H) 6 - 24 mg/dL   Creatinine, Ser 6.54 (H) 0.76 - 1.27 mg/dL   eGFR 20 (L) >40 fO/fpw/8.26   BUN/Creatinine Ratio 12 9 - 20   Sodium 136 134 - 144 mmol/L   Potassium 4.9 3.5 - 5.2 mmol/L   Chloride 100 96 - 106 mmol/L   CO2 22 20 - 29 mmol/L   Calcium  9.6 8.7 - 10.2 mg/dL   Total Protein 7.4 6.0 - 8.5 g/dL   Albumin 4.1 3.8 - 4.9 g/dL   Globulin, Total 3.3 1.5 - 4.5 g/dL   Bilirubin Total <9.7 0.0 - 1.2 mg/dL   Alkaline Phosphatase 79 44 - 121 IU/L    Comment: **Effective September 26, 2023 Alkaline Phosphatase**   reference interval will be changing to:              Age  Male          Male           0 -  5 days         47 - 127       47 - 127           6 - 10 days         29 - 242       29 - 242          11 - 20 days        109 - 357      109 - 357          21 - 30 days         94 - 494       94 - 494           1 -  2 months      149 - 539      149 - 539           3 -  6 months      131 - 452      131 - 452           7 - 11 months      117 - 401      117 - 401   12 months -  6 years       158 - 369      158 - 369           7 - 12 years       150 - 409      150 - 409               13 years       156 - 435       78 - 227               14 years       114 - 375       64 - 161               15 years        88 - 279       56 - 134               16 years        74 - 207       51 - 121               17 years        63 - 161       47 - 113          18 - 20 years        51 - 125       42 - 106          21 - 50 years         47 - 123       41 - 116          51 - 80 years        49 - 135       51 - 125              >80 years        48 - 129       48 - 129    AST  15 0 - 40 IU/L   ALT 9  0 - 44 IU/L      Assessment & Plan:  Kayla was seen today for follow-up.  Essential hypertension  Hyperaldosteronism (HCC) -     Ambulatory referral to Endocrinology  CKD (chronic kidney disease) stage 4, GFR 15-29 ml/min (HCC)   Patient instructed to comply with medications as prescribed and f/u with Nephrology.  Problem List Items Addressed This Visit       Cardiovascular and Mediastinum   Essential hypertension - Primary     Endocrine   Hyperaldosteronism (HCC)   Relevant Orders   Ambulatory referral to Endocrinology     Genitourinary   CKD (chronic kidney disease) stage 4, GFR 15-29 ml/min (HCC)    Return in about 4 weeks (around 10/12/2023) for BP followup.   Total time spent: 20 minutes  Sherrill Cinderella Perry, MD  09/14/2023   This document may have been prepared by [facility] Voice Recognition software and as such may include unintentional dictation errors.

## 2023-09-16 ENCOUNTER — Other Ambulatory Visit (INDEPENDENT_AMBULATORY_CARE_PROVIDER_SITE_OTHER)

## 2023-09-16 ENCOUNTER — Ambulatory Visit (INDEPENDENT_AMBULATORY_CARE_PROVIDER_SITE_OTHER): Admitting: Vascular Surgery

## 2023-09-26 DIAGNOSIS — N184 Chronic kidney disease, stage 4 (severe): Secondary | ICD-10-CM | POA: Diagnosis not present

## 2023-09-26 DIAGNOSIS — E790 Hyperuricemia without signs of inflammatory arthritis and tophaceous disease: Secondary | ICD-10-CM | POA: Diagnosis not present

## 2023-09-26 DIAGNOSIS — I10 Essential (primary) hypertension: Secondary | ICD-10-CM | POA: Diagnosis not present

## 2023-09-26 DIAGNOSIS — I129 Hypertensive chronic kidney disease with stage 1 through stage 4 chronic kidney disease, or unspecified chronic kidney disease: Secondary | ICD-10-CM | POA: Diagnosis not present

## 2023-09-26 DIAGNOSIS — E2609 Other primary hyperaldosteronism: Secondary | ICD-10-CM | POA: Diagnosis not present

## 2023-10-12 ENCOUNTER — Ambulatory Visit: Admitting: Internal Medicine

## 2023-10-24 ENCOUNTER — Encounter: Payer: Self-pay | Admitting: Internal Medicine

## 2023-10-24 ENCOUNTER — Ambulatory Visit (INDEPENDENT_AMBULATORY_CARE_PROVIDER_SITE_OTHER): Admitting: Internal Medicine

## 2023-10-24 VITALS — BP 128/70 | HR 80 | Ht 67.0 in | Wt 136.2 lb

## 2023-10-24 DIAGNOSIS — E782 Mixed hyperlipidemia: Secondary | ICD-10-CM | POA: Diagnosis not present

## 2023-10-24 DIAGNOSIS — E269 Hyperaldosteronism, unspecified: Secondary | ICD-10-CM

## 2023-10-24 DIAGNOSIS — N184 Chronic kidney disease, stage 4 (severe): Secondary | ICD-10-CM | POA: Diagnosis not present

## 2023-10-24 DIAGNOSIS — I10 Essential (primary) hypertension: Secondary | ICD-10-CM

## 2023-10-24 MED ORDER — CLONIDINE 0.3 MG/24HR TD PTWK: 0.3000 mg | MEDICATED_PATCH | TRANSDERMAL | 2 refills | Status: AC

## 2023-10-24 MED ORDER — MINOXIDIL 2.5 MG PO TABS: 7.5000 mg | ORAL_TABLET | Freq: Two times a day (BID) | ORAL | 2 refills | Status: AC

## 2023-10-24 MED ORDER — SPIRONOLACTONE 100 MG PO TABS
100.0000 mg | ORAL_TABLET | Freq: Two times a day (BID) | ORAL | 2 refills | Status: AC
Start: 2023-10-24 — End: 2024-01-22

## 2023-10-24 NOTE — Progress Notes (Signed)
 Established Patient Office Visit  Subjective:  Patient ID: Christopher Kirtz., male    DOB: 1970/11/18  Age: 53 y.o. MRN: [id number]  Chief Complaint  Patient presents with   Follow-up    4 week follow up    No new complaints, here for BP follow up and medication refills. BP has improved since he'Christopher Mcdaniel now fully compliant with his antihypertensives.     No other concerns at this time.   Past Medical History:  Diagnosis Date   Aphakia, right    Arthritis    knees   Cataract cortical, senile, left    Chronic kidney disease    CME (cystoid macular edema)    Complication of anesthesia    wakes up during surgery   Failed corneal transplant    GERD (gastroesophageal reflux disease)    History of hiatal hernia    Hyperaldosteronism    Hypertension    Hypotony of eye    Macular degeneration    Mature cataract    Non-recurrent bilateral inguinal hernia without obstruction or gangrene    Panuveitis    Panuveitis    POAG (primary open-angle glaucoma)    Uveitic glaucoma     Past Surgical History:  Procedure Laterality Date   ADRENAL GLAND SURGERY Bilateral 2016   biopsy   CORNEAL TRANSPLANT     EYE SURGERY  2017   fracture repair of left knee     FRACTURE SURGERY     FRACTURED LEFT KNEE   glaucoma eye surgery     INSERTION OF MESH  03/28/2023   Procedure: INSERTION OF MESH;  Surgeon: Rodolph Romano, MD;  Location: [facility] ORS;  Service: General;;   lens eye surgery      Social History   Socioeconomic History   Marital status: Married    Spouse name: Not on file   Number of children: Not on file   Years of education: Not on file   Highest education level: Not on file  Occupational History   Not on file  Tobacco Use   Smoking status: Never   Smokeless tobacco: Never  Vaping Use   Vaping status: Never Used  Substance and Sexual Activity   Alcohol use: No   Drug use: No   Sexual activity: Not on file  Other Topics Concern   Not on file  Social  History Narrative   Not on file   Social Drivers of Health   Financial Resource Strain: Not on file  Food Insecurity: Not on file  Transportation Needs: Not on file  Physical Activity: Not on file  Stress: Not on file  Social Connections: Not on file  Intimate Partner Violence: Not on file    Family History  Problem Relation Age of Onset   Allergies Mother    Hypertension Mother    Healthy Father    Cancer Neg Hx     Allergies  Allergen Reactions   Ace Inhibitors Swelling    Lip swelling    Quinapril Swelling    angioedema    Outpatient Medications Prior to Visit  Medication Sig   acetaminophen  (TYLENOL ) 500 MG tablet Take 500 mg by mouth every 6 (six) hours as needed.   allopurinol (ZYLOPRIM) 100 MG tablet Take 100 mg by mouth daily.   Ascorbic Acid (VITAMIN C GUMMIE PO) Take 2 tablets by mouth daily.   atorvastatin  (LIPITOR) 20 MG tablet Take 1 tablet (20 mg total) by mouth daily.   carvedilol  (COREG ) 25 MG tablet  Take 2 tablets (50 mg total) by mouth 2 (two) times daily.   cetirizine (ZYRTEC) 10 MG tablet Take 10 mg by mouth daily. (Patient not taking: Reported on 09/14/2023)   chlorthalidone  (HYGROTON ) 50 MG tablet Take 1 tablet (50 mg total) by mouth daily.   cholecalciferol (VITAMIN D3) 25 MCG (1000 UNIT) tablet Take 1,000 Units by mouth daily.   cloNIDine  (CATAPRES  - DOSED IN MG/24 HR) 0.3 mg/24hr patch Place 1 patch (0.3 mg total) onto the skin once a week.   dapagliflozin propanediol (FARXIGA) 5 MG TABS tablet Take 5 mg by mouth daily.   doxazosin (CARDURA) 2 MG tablet Take 2 mg by mouth daily.   ELDERBERRY PO Take 2 tablets by mouth daily.   minoxidil  (LONITEN ) 2.5 MG tablet Take 3 tablets (7.5 mg total) by mouth 2 (two) times daily.   ondansetron  (ZOFRAN ) 4 MG tablet Take 4 mg by mouth every 8 (eight) hours as needed for nausea or vomiting. (Patient not taking: Reported on 09/14/2023)   pantoprazole (PROTONIX) 40 MG tablet Take 40 mg by mouth 2 (two) times daily.  (Patient not taking: Reported on 09/14/2023)   prednisoLONE acetate (PRED FORTE) 1 % ophthalmic suspension Place 1 drop into both eyes every 4 (four) hours.   spironolactone  (ALDACTONE ) 100 MG tablet Take 1 tablet (100 mg total) by mouth 2 (two) times daily.   No facility-administered medications prior to visit.    Review of Systems  Constitutional: Negative.   HENT: Negative.    Eyes: Negative.   Respiratory: Negative.    Cardiovascular: Negative.   Gastrointestinal: Negative.   Genitourinary: Negative.   Skin: Negative.   Neurological: Negative.   Endo/Heme/Allergies: Negative.        Objective:   BP 128/70   Pulse 80   Ht 5' 7 (1.702 m)   Wt 136 lb 3.2 oz (61.8 kg)   SpO2 98%   BMI 21.33 kg/m   Vitals:   10/24/23 1127  BP: 128/70  Pulse: 80  Height: 5' 7 (1.702 m)  Weight: 136 lb 3.2 oz (61.8 kg)  SpO2: 98%  BMI (Calculated): 21.33    Physical Exam Vitals reviewed.  Constitutional:      Appearance: Normal appearance.  HENT:     Head: Normocephalic.     Left Ear: There is no impacted cerumen.     Nose: Nose normal.     Mouth/Throat:     Mouth: Mucous membranes are moist.     Pharynx: No posterior oropharyngeal erythema.  Eyes:     Extraocular Movements: Extraocular movements intact.     Pupils: Pupils are equal, round, and reactive to light.     Comments: Visually impaired  Cardiovascular:     Rate and Rhythm: Regular rhythm.     Chest Wall: PMI is not displaced.     Pulses: Normal pulses.     Heart sounds: Normal heart sounds. No murmur heard. Pulmonary:     Effort: Pulmonary effort is normal.     Breath sounds: Normal air entry. No rhonchi or rales.  Abdominal:     General: Abdomen is flat. Bowel sounds are normal. There is abdominal bruit. There is no distension.     Palpations: Abdomen is soft. There is pulsatile mass (left of midline). There is no hepatomegaly, splenomegaly or mass.     Tenderness: There is no abdominal tenderness.   Musculoskeletal:        General: Normal range of motion.     Cervical back: Normal  range of motion and neck supple.     Right lower leg: No edema.     Left lower leg: No edema.  Skin:    General: Skin is warm and dry.  Neurological:     General: No focal deficit present.     Mental Status: He is alert and oriented to person, place, and time.     Cranial Nerves: No cranial nerve deficit.     Motor: No weakness.  Psychiatric:        Mood and Affect: Mood normal.        Behavior: Behavior normal.      No results found for any visits on 10/24/23.      Assessment & Plan:  Roran was seen today for follow-up.  Essential (primary) hypertension  Essential hypertension  Hyperaldosteronism    Problem List Items Addressed This Visit       Cardiovascular and Mediastinum   Essential hypertension     Endocrine   Hyperaldosteronism   Other Visit Diagnoses       Essential (primary) hypertension           Return in about 3 months (around 01/24/2024) for fu with labs prior, BP followup.   Total time spent: 20 minutes  Sherrill Cinderella Perry, MD  10/24/2023   This document may have been prepared by [facility] Voice Recognition software and as such may include unintentional dictation errors.

## 2023-10-24 NOTE — Patient Instructions (Signed)
 No new complaints, here for BP follow up and medication refills. BP has considerably improved now that he'Christopher Mcdaniel fully compliant with his antihypertensives.

## 2023-10-25 ENCOUNTER — Ambulatory Visit (INDEPENDENT_AMBULATORY_CARE_PROVIDER_SITE_OTHER): Admitting: Vascular Surgery

## 2023-10-25 ENCOUNTER — Encounter (INDEPENDENT_AMBULATORY_CARE_PROVIDER_SITE_OTHER): Payer: Self-pay | Admitting: Vascular Surgery

## 2023-10-25 ENCOUNTER — Ambulatory Visit (INDEPENDENT_AMBULATORY_CARE_PROVIDER_SITE_OTHER)

## 2023-10-25 VITALS — BP 133/89 | HR 80 | Resp 16 | Ht 67.0 in | Wt 134.6 lb

## 2023-10-25 DIAGNOSIS — E782 Mixed hyperlipidemia: Secondary | ICD-10-CM | POA: Diagnosis not present

## 2023-10-25 DIAGNOSIS — I7143 Infrarenal abdominal aortic aneurysm, without rupture: Secondary | ICD-10-CM | POA: Diagnosis not present

## 2023-10-25 DIAGNOSIS — I1A0 Resistant hypertension: Secondary | ICD-10-CM

## 2023-10-25 NOTE — Assessment & Plan Note (Signed)
 We performed a duplex today for further evaluation of his aorta.  The maximal diameter that we saw on the aorta was 2.94 cm.  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 No role for intervention at this size.  Recommend: No surgery or intervention is indicated at this time.  The patient has an asymptomatic abdominal aortic aneurysm that is less than 4 cm in maximal diameter.    I have reviewed the natural history of abdominal aortic aneurysm and the small risk of rupture for aneurysm less than 5 cm in size. The iliac aneurysm size for prophylactic repair would generally be 3 cm in diameter and we will be following this as well.   However, as these small aneurysms tend to enlarge over time, continued surveillance with ultrasound or CT scan is mandatory.   I have also discussed optimizing medical management with hypertension and lipid control and the negative effect that any tobacco products have on aneurysmal disease.  The patient is also encouraged to exercise a minimum of 30 minutes 4 times a week.   Should the patient develop new onset abdominal or back pain or signs of peripheral embolization they are instructed to seek medical attention immediately and to alert the physician providing care that they have an aneurysm.   The patient voices their understanding.  The patient will return in 12 months with an aortic duplex.

## 2023-10-25 NOTE — Progress Notes (Signed)
 MRN : [id number]  Christopher Mcdaniel. is a 53 y.o. (May 13, 1970) male who presents with chief complaint of  Chief Complaint  Patient presents with   Follow-up    fu 3-4 months + AAA  .  History of Present Illness: Patient returns today in follow up of his abdominal aortic aneurysm.  An outside duplex had suggested a 3.7 cm abdominal aortic aneurysm in the proximal infrarenal aorta.  He had no aneurysm related symptoms.  We performed a duplex today for further evaluation of his aorta.  The maximal diameter that we saw on the aorta was 2.94 cm.  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  Current Outpatient Medications  Medication Sig Dispense Refill   acetaminophen  (TYLENOL ) 500 MG tablet Take 500 mg by mouth every 6 (six) hours as needed.     allopurinol (ZYLOPRIM) 100 MG tablet Take 100 mg by mouth daily.     Ascorbic Acid (VITAMIN C GUMMIE PO) Take 2 tablets by mouth daily.     atorvastatin  (LIPITOR) 20 MG tablet Take 1 tablet (20 mg total) by mouth daily. 90 tablet 1   carvedilol  (COREG ) 25 MG tablet Take 2 tablets (50 mg total) by mouth 2 (two) times daily. 360 tablet 1   chlorthalidone  (HYGROTON ) 50 MG tablet Take 1 tablet (50 mg total) by mouth daily. 90 tablet 1   cholecalciferol (VITAMIN D3) 25 MCG (1000 UNIT) tablet Take 1,000 Units by mouth daily.     cloNIDine  (CATAPRES  - DOSED IN MG/24 HR) 0.3 mg/24hr patch Place 1 patch (0.3 mg total) onto the skin once a week. 8 patch 2   dapagliflozin propanediol (FARXIGA) 5 MG TABS tablet Take 5 mg by mouth daily.     doxazosin (CARDURA) 2 MG tablet Take 2 mg by mouth daily.     ELDERBERRY PO Take 2 tablets by mouth daily.     minoxidil  (LONITEN ) 2.5 MG tablet Take 3 tablets (7.5 mg total) by mouth 2 (two) times daily. 180 tablet 2   ondansetron  (ZOFRAN ) 4 MG tablet Take 4 mg by mouth every 8 (eight) hours as needed for nausea or vomiting.     pantoprazole (PROTONIX) 40 MG tablet Take 40  mg by mouth 2 (two) times daily.     prednisoLONE acetate (PRED FORTE) 1 % ophthalmic suspension Place 1 drop into both eyes every 4 (four) hours.     spironolactone  (ALDACTONE ) 100 MG tablet Take 1 tablet (100 mg total) by mouth 2 (two) times daily. 60 tablet 2   cetirizine (ZYRTEC) 10 MG tablet Take 10 mg by mouth daily. (Patient not taking: Reported on 09/14/2023)     No current facility-administered medications for this visit.    Past Medical History:  Diagnosis Date   Aphakia, right    Arthritis    knees   Cataract cortical, senile, left    Chronic kidney disease    CME (cystoid macular edema)    Complication of anesthesia    wakes up during surgery   Failed corneal transplant    GERD (gastroesophageal reflux disease)    History of hiatal hernia    Hyperaldosteronism    Hypertension    Hypotony of eye    Macular degeneration    Mature cataract    Non-recurrent bilateral inguinal hernia without obstruction or gangrene    Panuveitis    Panuveitis    POAG (primary open-angle glaucoma)    Uveitic glaucoma  Past Surgical History:  Procedure Laterality Date   ADRENAL GLAND SURGERY Bilateral 2016   biopsy   CORNEAL TRANSPLANT     EYE SURGERY  2017   fracture repair of left knee     FRACTURE SURGERY     FRACTURED LEFT KNEE   glaucoma eye surgery     INSERTION OF MESH  03/28/2023   Procedure: INSERTION OF MESH;  Surgeon: Rodolph Romano, MD;  Location: [facility] OR[facility];  Service: General;;   lens eye surgery       Social History   Tobacco Use   Smoking status: Never   Smokeless tobacco: Never  Vaping Use   Vaping status: Never Used  Substance Use Topics   Alcohol use: No   Drug use: No      Family History  Problem Relation Age of Onset   Allergies Mother    Hypertension Mother    Healthy Father    Cancer Neg Hx      Allergies  Allergen Reactions   Ace Inhibitors Swelling    Lip swelling    Quinapril Swelling    angioedema     REVIEW OF  SYSTEMS (Negative unless checked)  Constitutional: [] Weight loss  [] Fever  [] Chills Cardiac: [] Chest pain   [] Chest pressure   [] Palpitations   [] Shortness of breath when laying flat   [] Shortness of breath at rest   [] Shortness of breath with exertion. Vascular:  [] Pain in legs with walking   [] Pain in legs at rest   [] Pain in legs when laying flat   [] Claudication   [] Pain in feet when walking  [] Pain in feet at rest  [] Pain in feet when laying flat   [] History of DVT   [] Phlebitis   [] Swelling in legs   [] Varicose veins   [] Non-healing ulcers Pulmonary:   [] Uses home oxygen   [] Productive cough   [] Hemoptysis   [] Wheeze  [] COPD   [] Asthma Neurologic:  [] Dizziness  [] Blackouts   [] Seizures   [] History of stroke   [] History of TIA  [] Aphasia   [] Temporary blindness   [] Dysphagia   [] Weakness or numbness in arms   [] Weakness or numbness in legs Musculoskeletal:  [x] Arthritis   [] Joint swelling   [x] Joint pain   [] Low back pain Hematologic:  [] Easy bruising  [] Easy bleeding   [] Hypercoagulable state   [] Anemic   Gastrointestinal:  [] Blood in stool   [] Vomiting blood  [x] Gastroesophageal reflux/heartburn   [] Abdominal pain Genitourinary:  [x] Chronic kidney disease   [] Difficult urination  [] Frequent urination  [] Burning with urination   [] Hematuria Skin:  [] Rashes   [] Ulcers   [] Wounds Psychological:  [] History of anxiety   []  History of major depression.  Physical Examination  BP 133/89   Pulse 80   Resp 16   Ht 5' 7 (1.702 m)   Wt 134 lb 9.6 oz (61.1 kg)   BMI 21.08 kg/m  Gen:  WD/WN, NAD Head: [place]/AT, No temporalis wasting. Ear/Nose/Throat: Hearing grossly intact, nares w/o erythema or drainage Eyes: Conjunctiva clear. Sclera non-icteric Neck: Supple.  Trachea midline Pulmonary:  Good air movement, no use of accessory muscles.  Cardiac: RRR, no JVD Vascular:  Vessel Right Left  Radial Palpable Palpable                                   Gastrointestinal: soft,  non-tender/non-distended. No guarding/reflex.  Musculoskeletal: M/S 5/5 throughout.  No deformity or atrophy. No edema. Neurologic:  Sensation grossly intact in extremities.  Symmetrical.  Speech is fluent.  Psychiatric: Judgment intact, Mood & affect appropriate for pt's clinical situation. Dermatologic: No rashes or ulcers noted.  No cellulitis or open wounds.      Labs Recent Results (from the past 2160 hours)  CBC With Diff/Platelet     Status: None   Collection Time: 09/09/23  8:33 AM  Result Value Ref Range   WBC CANCELED x10E3/uL    Comment: Test not performed. Deterioration occurred during specimen handling. [facility] is providing the patient with re-collection instructions.  Result canceled by the ancillary.    RBC CANCELED     Comment: Test not performed  Result canceled by the ancillary.    Hemoglobin CANCELED     Comment: Test not performed  Result canceled by the ancillary.    Hematocrit CANCELED     Comment: Test not performed  Result canceled by the ancillary.    Platelets CANCELED     Comment: Test not performed  Result canceled by the ancillary.    Neutrophils CANCELED     Comment: Test not performed  Result canceled by the ancillary.    Lymphs CANCELED     Comment: Test not performed  Result canceled by the ancillary.    Monocytes CANCELED     Comment: Test not performed  Result canceled by the ancillary.    Eos CANCELED     Comment: Test not performed  Result canceled by the ancillary.    Lymphocytes Absolute CANCELED     Comment: Test not performed  Result canceled by the ancillary.    EOS (ABSOLUTE) CANCELED     Comment: Test not performed  Result canceled by the ancillary.    Basophils Absolute CANCELED     Comment: Test not performed  Result canceled by the ancillary.   Comprehensive metabolic panel with GFR     Status: Abnormal   Collection Time: 09/09/23  8:33 AM  Result Value Ref Range   Glucose 99 70 - 99 mg/dL    BUN 40 (H) 6 - 24 mg/dL   Creatinine, Ser 6.54 (H) 0.76 - 1.27 mg/dL   eGFR 20 (L) >40 fO/fpw/8.26   BUN/Creatinine Ratio 12 9 - 20   Sodium 136 134 - 144 mmol/L   Potassium 4.9 3.5 - 5.2 mmol/L   Chloride 100 96 - 106 mmol/L   CO2 22 20 - 29 mmol/L   Calcium  9.6 8.7 - 10.2 mg/dL   Total Protein 7.4 6.0 - 8.5 g/dL   Albumin 4.1 3.8 - 4.9 g/dL   Globulin, Total 3.3 1.5 - 4.5 g/dL   Bilirubin Total <9.7 0.0 - 1.2 mg/dL   Alkaline Phosphatase 79 44 - 121 IU/L    Comment: **Effective September 26, 2023 Alkaline Phosphatase**   reference interval will be changing to:              Age                Male          Male           0 -  5 days         47 - 127       47 - 127           6 - 10 days         29 - 242       29 - 242  11 - 20 days        109 - 357      109 - 357          21 - 30 days         94 - 494       94 - 494           1 -  2 months      149 - 539      149 - 539           3 -  6 months      131 - 452      131 - 452           7 - 11 months      117 - 401      117 - 401   12 months -  6 years       158 - 369      158 - 369           7 - 12 years       150 - 409      150 - 409               13 years       156 - 435       78 - 227               14 years       114 - 375       64 - 161               15 years        88 - 279       56 - 134               16 years        74 - 207       51 - 121               17 years        63 - 161       47 - 113          18 - 20 years        51 - 125       42 - 106          21 - 50 years         47 - 123       41 - 116          51 - 80 years        49 - 135       51 - 125              >80 years        48 - 129       48 - 129    AST 15 0 - 40 IU/L   ALT 9 0 - 44 IU/L    Radiology No results found.  Assessment/Plan  AAA (abdominal aortic aneurysm) without rupture We performed a duplex today for further evaluation of his aorta.  The maximal diameter that we saw on the aorta was 2.94 cm.  He did have mild aneurysmal degeneration of  both common iliac arteries to 2.1 cm bilaterally. No role for intervention at this size.  Recommend: No surgery or intervention is indicated at this time.  The patient has an asymptomatic abdominal aortic aneurysm that is less than 4 cm in maximal diameter.    I have reviewed the natural history of abdominal aortic aneurysm and the small risk of rupture for aneurysm less than 5 cm in size. The iliac aneurysm size for prophylactic repair would generally be 3 cm in diameter and we will be following this as well.   However, as these small aneurysms tend to enlarge over time, continued surveillance with ultrasound or CT scan is mandatory.   I have also discussed optimizing medical management with hypertension and lipid control and the negative effect that any tobacco products have on aneurysmal disease.  The patient is also encouraged to exercise a minimum of 30 minutes 4 times a week.   Should the patient develop new onset abdominal or back pain or signs of peripheral embolization they are instructed to seek medical attention immediately and to alert the physician providing care that they have an aneurysm.   The patient voices their understanding.  The patient will return in 12 months with an aortic duplex.  Essential hypertension blood pressure control important in reducing the progression of atherosclerotic disease and aneurysmal growth. On appropriate oral medications.     Hyperlipidemia lipid control important in reducing the progression of atherosclerotic disease. Continue statin therapy  Selinda Gu, MD  10/25/2023 11:38 AM    This note was created with Dragon medical transcription system.  Any errors from dictation are purely unintentional

## 2023-12-09 ENCOUNTER — Other Ambulatory Visit: Payer: Self-pay | Admitting: Internal Medicine

## 2023-12-09 DIAGNOSIS — I10 Essential (primary) hypertension: Secondary | ICD-10-CM

## 2023-12-15 ENCOUNTER — Encounter: Payer: Self-pay | Admitting: General Surgery

## 2024-01-24 ENCOUNTER — Ambulatory Visit: Admitting: Internal Medicine

## 2024-02-01 ENCOUNTER — Ambulatory Visit: Admitting: Internal Medicine

## 2024-02-10 ENCOUNTER — Other Ambulatory Visit: Payer: Self-pay | Admitting: Internal Medicine

## 2024-02-10 DIAGNOSIS — I10 Essential (primary) hypertension: Secondary | ICD-10-CM

## 2024-02-13 ENCOUNTER — Other Ambulatory Visit: Payer: Self-pay | Admitting: Internal Medicine

## 2024-02-13 DIAGNOSIS — E782 Mixed hyperlipidemia: Secondary | ICD-10-CM

## 2024-03-09 ENCOUNTER — Ambulatory Visit: Admitting: Internal Medicine

## 2024-10-23 ENCOUNTER — Other Ambulatory Visit (INDEPENDENT_AMBULATORY_CARE_PROVIDER_SITE_OTHER)

## 2024-10-23 ENCOUNTER — Ambulatory Visit (INDEPENDENT_AMBULATORY_CARE_PROVIDER_SITE_OTHER): Admitting: Vascular Surgery
